# Patient Record
Sex: Male | Born: 1935 | Race: White | Hispanic: No | Smoking: Never smoker
Health system: Southern US, Community
[De-identification: ages and names within clinical notes are randomized; demographics above are authoritative.]

## PROBLEM LIST (undated history)

## (undated) DIAGNOSIS — K219 Gastro-esophageal reflux disease without esophagitis: Secondary | ICD-10-CM

## (undated) DIAGNOSIS — I10 Essential (primary) hypertension: Secondary | ICD-10-CM

## (undated) DIAGNOSIS — I2510 Atherosclerotic heart disease of native coronary artery without angina pectoris: Secondary | ICD-10-CM

## (undated) DIAGNOSIS — I219 Acute myocardial infarction, unspecified: Secondary | ICD-10-CM

## (undated) DIAGNOSIS — E785 Hyperlipidemia, unspecified: Secondary | ICD-10-CM

## (undated) HISTORY — DX: Hyperlipidemia, unspecified: E78.5

## (undated) HISTORY — DX: Essential (primary) hypertension: I10

---

## 1999-03-08 ENCOUNTER — Inpatient Hospital Stay (HOSPITAL_COMMUNITY): Admission: EM | Admit: 1999-03-08 | Discharge: 1999-03-15 | Payer: Self-pay | Admitting: Emergency Medicine

## 1999-03-08 ENCOUNTER — Encounter: Payer: Self-pay | Admitting: Emergency Medicine

## 1999-03-09 ENCOUNTER — Encounter: Payer: Self-pay | Admitting: Family Medicine

## 1999-03-20 ENCOUNTER — Encounter: Admission: RE | Admit: 1999-03-20 | Discharge: 1999-03-20 | Payer: Self-pay | Admitting: Family Medicine

## 1999-04-05 ENCOUNTER — Ambulatory Visit (HOSPITAL_COMMUNITY): Admission: RE | Admit: 1999-04-05 | Discharge: 1999-04-05 | Payer: Self-pay | Admitting: Family Medicine

## 1999-04-09 HISTORY — PX: CORONARY ANGIOPLASTY WITH STENT PLACEMENT: SHX49

## 1999-04-17 ENCOUNTER — Encounter: Admission: RE | Admit: 1999-04-17 | Discharge: 1999-04-17 | Payer: Self-pay | Admitting: Sports Medicine

## 1999-04-20 ENCOUNTER — Encounter: Admission: RE | Admit: 1999-04-20 | Discharge: 1999-04-20 | Payer: Self-pay | Admitting: Family Medicine

## 1999-05-24 ENCOUNTER — Encounter: Admission: RE | Admit: 1999-05-24 | Discharge: 1999-05-24 | Payer: Self-pay | Admitting: Family Medicine

## 1999-06-14 ENCOUNTER — Encounter: Admission: RE | Admit: 1999-06-14 | Discharge: 1999-06-14 | Payer: Self-pay | Admitting: Family Medicine

## 1999-07-30 ENCOUNTER — Encounter: Admission: RE | Admit: 1999-07-30 | Discharge: 1999-07-30 | Payer: Self-pay | Admitting: Family Medicine

## 2000-01-24 ENCOUNTER — Encounter: Admission: RE | Admit: 2000-01-24 | Discharge: 2000-01-24 | Payer: Self-pay | Admitting: Family Medicine

## 2000-04-21 ENCOUNTER — Encounter: Admission: RE | Admit: 2000-04-21 | Discharge: 2000-04-21 | Payer: Self-pay | Admitting: Family Medicine

## 2000-08-05 ENCOUNTER — Encounter: Admission: RE | Admit: 2000-08-05 | Discharge: 2000-08-05 | Payer: Self-pay | Admitting: Sports Medicine

## 2000-10-29 ENCOUNTER — Encounter: Admission: RE | Admit: 2000-10-29 | Discharge: 2000-10-29 | Payer: Self-pay | Admitting: Family Medicine

## 2001-04-27 ENCOUNTER — Encounter: Admission: RE | Admit: 2001-04-27 | Discharge: 2001-04-27 | Payer: Self-pay | Admitting: Sports Medicine

## 2001-07-03 ENCOUNTER — Encounter: Admission: RE | Admit: 2001-07-03 | Discharge: 2001-07-03 | Payer: Self-pay | Admitting: Family Medicine

## 2001-07-06 ENCOUNTER — Encounter: Admission: RE | Admit: 2001-07-06 | Discharge: 2001-07-06 | Payer: Self-pay | Admitting: Family Medicine

## 2001-11-04 ENCOUNTER — Encounter: Admission: RE | Admit: 2001-11-04 | Discharge: 2001-11-04 | Payer: Self-pay | Admitting: Family Medicine

## 2002-02-05 ENCOUNTER — Encounter: Admission: RE | Admit: 2002-02-05 | Discharge: 2002-02-05 | Payer: Self-pay | Admitting: Family Medicine

## 2002-05-05 ENCOUNTER — Encounter: Admission: RE | Admit: 2002-05-05 | Discharge: 2002-05-05 | Payer: Self-pay | Admitting: Family Medicine

## 2002-05-24 ENCOUNTER — Ambulatory Visit (HOSPITAL_COMMUNITY): Admission: RE | Admit: 2002-05-24 | Discharge: 2002-05-24 | Payer: Self-pay | Admitting: Family Medicine

## 2002-05-24 ENCOUNTER — Encounter: Payer: Self-pay | Admitting: Family Medicine

## 2002-05-24 ENCOUNTER — Encounter: Admission: RE | Admit: 2002-05-24 | Discharge: 2002-05-24 | Payer: Self-pay | Admitting: Family Medicine

## 2002-11-04 ENCOUNTER — Encounter: Admission: RE | Admit: 2002-11-04 | Discharge: 2002-11-04 | Payer: Self-pay | Admitting: Family Medicine

## 2003-01-12 ENCOUNTER — Encounter: Admission: RE | Admit: 2003-01-12 | Discharge: 2003-01-12 | Payer: Self-pay | Admitting: Family Medicine

## 2003-04-25 ENCOUNTER — Encounter: Admission: RE | Admit: 2003-04-25 | Discharge: 2003-04-25 | Payer: Self-pay | Admitting: Family Medicine

## 2003-05-03 ENCOUNTER — Encounter: Admission: RE | Admit: 2003-05-03 | Discharge: 2003-05-03 | Payer: Self-pay | Admitting: Sports Medicine

## 2003-07-12 ENCOUNTER — Encounter: Admission: RE | Admit: 2003-07-12 | Discharge: 2003-07-12 | Payer: Self-pay | Admitting: Sports Medicine

## 2003-08-15 ENCOUNTER — Encounter: Admission: RE | Admit: 2003-08-15 | Discharge: 2003-08-15 | Payer: Self-pay | Admitting: Sports Medicine

## 2003-11-09 ENCOUNTER — Encounter: Admission: RE | Admit: 2003-11-09 | Discharge: 2003-11-09 | Payer: Self-pay | Admitting: Family Medicine

## 2003-12-30 ENCOUNTER — Ambulatory Visit: Payer: Self-pay | Admitting: Family Medicine

## 2004-01-06 ENCOUNTER — Ambulatory Visit: Payer: Self-pay | Admitting: Family Medicine

## 2004-03-29 ENCOUNTER — Ambulatory Visit: Payer: Self-pay | Admitting: Family Medicine

## 2004-04-30 ENCOUNTER — Ambulatory Visit: Payer: Self-pay | Admitting: Sports Medicine

## 2004-07-03 ENCOUNTER — Ambulatory Visit: Payer: Self-pay | Admitting: Family Medicine

## 2004-08-30 ENCOUNTER — Ambulatory Visit: Payer: Self-pay | Admitting: Family Medicine

## 2004-11-15 ENCOUNTER — Ambulatory Visit: Payer: Self-pay | Admitting: Family Medicine

## 2004-12-18 ENCOUNTER — Ambulatory Visit: Payer: Self-pay | Admitting: Family Medicine

## 2005-03-04 ENCOUNTER — Ambulatory Visit: Payer: Self-pay | Admitting: Sports Medicine

## 2005-06-05 ENCOUNTER — Ambulatory Visit: Payer: Self-pay | Admitting: Family Medicine

## 2006-06-05 DIAGNOSIS — I252 Old myocardial infarction: Secondary | ICD-10-CM | POA: Insufficient documentation

## 2006-06-05 DIAGNOSIS — I5022 Chronic systolic (congestive) heart failure: Secondary | ICD-10-CM

## 2006-07-23 ENCOUNTER — Ambulatory Visit: Payer: Self-pay | Admitting: Family Medicine

## 2006-07-23 ENCOUNTER — Encounter: Payer: Self-pay | Admitting: Family Medicine

## 2006-07-23 DIAGNOSIS — I10 Essential (primary) hypertension: Secondary | ICD-10-CM | POA: Insufficient documentation

## 2006-07-23 DIAGNOSIS — I2510 Atherosclerotic heart disease of native coronary artery without angina pectoris: Secondary | ICD-10-CM

## 2006-07-23 DIAGNOSIS — E785 Hyperlipidemia, unspecified: Secondary | ICD-10-CM | POA: Insufficient documentation

## 2006-07-23 LAB — CONVERTED CEMR LAB
ALT: 14 units/L (ref 0–53)
AST: 24 units/L (ref 0–37)
Albumin: 4.3 g/dL (ref 3.5–5.2)
Alkaline Phosphatase: 73 units/L (ref 39–117)
BUN: 14 mg/dL (ref 6–23)
CO2: 25 meq/L (ref 19–32)
Calcium: 9.5 mg/dL (ref 8.4–10.5)
Chloride: 103 meq/L (ref 96–112)
Creatinine, Ser: 1.19 mg/dL (ref 0.40–1.50)
Direct LDL: 149 mg/dL — ABNORMAL HIGH
Glucose, Bld: 89 mg/dL (ref 70–99)
PSA: 3.58 ng/mL (ref 0.10–4.00)
Potassium: 4.4 meq/L (ref 3.5–5.3)
Sodium: 139 meq/L (ref 135–145)
Total Bilirubin: 0.9 mg/dL (ref 0.3–1.2)
Total Protein: 7 g/dL (ref 6.0–8.3)

## 2006-07-24 ENCOUNTER — Encounter: Payer: Self-pay | Admitting: Family Medicine

## 2006-10-20 ENCOUNTER — Ambulatory Visit: Payer: Self-pay | Admitting: Family Medicine

## 2006-11-19 ENCOUNTER — Encounter: Payer: Self-pay | Admitting: *Deleted

## 2006-12-22 ENCOUNTER — Ambulatory Visit: Payer: Self-pay | Admitting: Family Medicine

## 2007-03-18 ENCOUNTER — Ambulatory Visit (HOSPITAL_COMMUNITY): Admission: RE | Admit: 2007-03-18 | Discharge: 2007-03-18 | Payer: Self-pay | Admitting: Family Medicine

## 2007-03-18 ENCOUNTER — Encounter: Payer: Self-pay | Admitting: Family Medicine

## 2007-03-18 ENCOUNTER — Ambulatory Visit: Payer: Self-pay | Admitting: Family Medicine

## 2007-03-24 LAB — CONVERTED CEMR LAB
ALT: 11 units/L (ref 0–53)
AST: 19 units/L (ref 0–37)
Albumin: 4.1 g/dL (ref 3.5–5.2)
Alkaline Phosphatase: 71 units/L (ref 39–117)
BUN: 14 mg/dL (ref 6–23)
CO2: 25 meq/L (ref 19–32)
Calcium: 9 mg/dL (ref 8.4–10.5)
Chloride: 106 meq/L (ref 96–112)
Creatinine, Ser: 1.22 mg/dL (ref 0.40–1.50)
Digitoxin Lvl: 0.2 ng/mL — ABNORMAL LOW (ref 0.8–2.0)
Glucose, Bld: 100 mg/dL — ABNORMAL HIGH (ref 70–99)
Potassium: 4.7 meq/L (ref 3.5–5.3)
Sodium: 140 meq/L (ref 135–145)
TSH: 0.837 microintl units/mL (ref 0.350–5.50)
Total Bilirubin: 0.6 mg/dL (ref 0.3–1.2)
Total Protein: 6.9 g/dL (ref 6.0–8.3)

## 2007-03-26 ENCOUNTER — Ambulatory Visit: Payer: Self-pay | Admitting: Family Medicine

## 2007-04-10 ENCOUNTER — Ambulatory Visit: Payer: Self-pay | Admitting: Family Medicine

## 2007-04-15 ENCOUNTER — Encounter: Payer: Self-pay | Admitting: Family Medicine

## 2008-04-30 ENCOUNTER — Encounter: Payer: Self-pay | Admitting: Emergency Medicine

## 2008-04-30 ENCOUNTER — Inpatient Hospital Stay (HOSPITAL_COMMUNITY): Admission: AD | Admit: 2008-04-30 | Discharge: 2008-05-06 | Payer: Self-pay | Admitting: Family Medicine

## 2008-04-30 ENCOUNTER — Encounter: Payer: Self-pay | Admitting: Family Medicine

## 2008-04-30 ENCOUNTER — Ambulatory Visit: Payer: Self-pay | Admitting: Family Medicine

## 2008-05-02 ENCOUNTER — Encounter: Payer: Self-pay | Admitting: Family Medicine

## 2008-05-16 ENCOUNTER — Telehealth: Payer: Self-pay | Admitting: Family Medicine

## 2008-06-01 ENCOUNTER — Telehealth: Payer: Self-pay | Admitting: *Deleted

## 2008-06-06 ENCOUNTER — Telehealth: Payer: Self-pay | Admitting: Family Medicine

## 2008-06-08 ENCOUNTER — Ambulatory Visit: Payer: Self-pay | Admitting: Family Medicine

## 2008-06-09 ENCOUNTER — Telehealth: Payer: Self-pay | Admitting: Family Medicine

## 2008-06-09 ENCOUNTER — Encounter: Payer: Self-pay | Admitting: Family Medicine

## 2008-06-22 ENCOUNTER — Ambulatory Visit: Payer: Self-pay | Admitting: Family Medicine

## 2008-06-27 ENCOUNTER — Ambulatory Visit: Payer: Self-pay | Admitting: Family Medicine

## 2008-06-27 ENCOUNTER — Encounter: Payer: Self-pay | Admitting: Family Medicine

## 2008-06-27 LAB — CONVERTED CEMR LAB
ALT: <8 units/L (ref 0–53)
AST: 12 units/L (ref 0–37)
Albumin: 4 g/dL (ref 3.5–5.2)
Alkaline Phosphatase: 72 units/L (ref 39–117)
BUN: 12 mg/dL (ref 6–23)
CO2: 25 meq/L (ref 19–32)
Calcium: 8.4 mg/dL (ref 8.4–10.5)
Chloride: 109 meq/L (ref 96–112)
Cholesterol: 92 mg/dL (ref 0–200)
Creatinine, Ser: 0.98 mg/dL (ref 0.40–1.50)
Glucose, Bld: 104 mg/dL — ABNORMAL HIGH (ref 70–99)
HDL: 31 mg/dL — ABNORMAL LOW (ref >39)
LDL Cholesterol: 44 mg/dL (ref 0–99)
PSA: 3.33 ng/mL (ref 0.10–4.00)
Potassium: 4.3 meq/L (ref 3.5–5.3)
Sodium: 142 meq/L (ref 135–145)
Total Bilirubin: 0.4 mg/dL (ref 0.3–1.2)
Total CHOL/HDL Ratio: 3
Total Protein: 6.4 g/dL (ref 6.0–8.3)
Triglycerides: 83 mg/dL (ref <150)
VLDL: 17 mg/dL (ref 0–40)

## 2008-06-28 ENCOUNTER — Encounter: Payer: Self-pay | Admitting: Family Medicine

## 2008-07-13 ENCOUNTER — Ambulatory Visit: Payer: Self-pay | Admitting: Family Medicine

## 2008-08-03 ENCOUNTER — Ambulatory Visit: Payer: Self-pay | Admitting: Family Medicine

## 2008-08-31 ENCOUNTER — Ambulatory Visit: Payer: Self-pay | Admitting: Family Medicine

## 2008-09-26 ENCOUNTER — Ambulatory Visit: Payer: Self-pay | Admitting: Family Medicine

## 2008-10-12 ENCOUNTER — Encounter: Payer: Self-pay | Admitting: Family Medicine

## 2008-10-26 ENCOUNTER — Encounter: Payer: Self-pay | Admitting: Family Medicine

## 2009-04-13 ENCOUNTER — Encounter: Payer: Self-pay | Admitting: Family Medicine

## 2009-04-17 ENCOUNTER — Ambulatory Visit: Payer: Self-pay | Admitting: Family Medicine

## 2009-04-17 DIAGNOSIS — K5900 Constipation, unspecified: Secondary | ICD-10-CM | POA: Insufficient documentation

## 2009-04-26 ENCOUNTER — Ambulatory Visit: Payer: Self-pay | Admitting: Family Medicine

## 2009-06-27 ENCOUNTER — Encounter: Payer: Self-pay | Admitting: Family Medicine

## 2009-06-28 ENCOUNTER — Encounter: Payer: Self-pay | Admitting: Family Medicine

## 2009-10-31 ENCOUNTER — Encounter: Payer: Self-pay | Admitting: Family Medicine

## 2009-10-31 ENCOUNTER — Ambulatory Visit: Payer: Self-pay | Admitting: Family Medicine

## 2009-12-13 LAB — CONVERTED CEMR LAB
ALT: <8 units/L (ref 0–53)
AST: 15 units/L (ref 0–37)
Albumin: 4.2 g/dL (ref 3.5–5.2)
Alkaline Phosphatase: 63 units/L (ref 39–117)
BUN: 12 mg/dL (ref 6–23)
CO2: 23 meq/L (ref 19–32)
Calcium: 8.6 mg/dL (ref 8.4–10.5)
Chloride: 104 meq/L (ref 96–112)
Cholesterol: 101 mg/dL (ref 0–200)
Creatinine, Ser: 0.95 mg/dL (ref 0.40–1.50)
Glucose, Bld: 91 mg/dL (ref 70–99)
HDL: 30 mg/dL — ABNORMAL LOW (ref >39)
LDL Cholesterol: 57 mg/dL (ref 0–99)
Potassium: 3.6 meq/L (ref 3.5–5.3)
Sodium: 138 meq/L (ref 135–145)
Total Bilirubin: 0.8 mg/dL (ref 0.3–1.2)
Total CHOL/HDL Ratio: 3.4
Total Protein: 6.5 g/dL (ref 6.0–8.3)
Triglycerides: 71 mg/dL (ref <150)
VLDL: 14 mg/dL (ref 0–40)

## 2010-01-12 ENCOUNTER — Encounter: Payer: Self-pay | Admitting: Family Medicine

## 2010-05-02 ENCOUNTER — Encounter (INDEPENDENT_AMBULATORY_CARE_PROVIDER_SITE_OTHER): Payer: Self-pay | Admitting: *Deleted

## 2010-05-08 NOTE — Assessment & Plan Note (Signed)
Summary: review meds/eo  Nurse Visit patient walks in stating he has concern that he may not be taking medciations correctly.  The concern is about Loperamide 2 mg that he was instructed to get after coming to office on 04/13/2009. States he has been taking 3 tablets each night since 04/13/2009. Upon further  questioning patient states he never had diarrhea. His problem  on the night before coming  to the office on 04/13/2009 was a feeling of constipation. States on 04/12/2009 he felt much pressure and discomfort in rectal area like he needed to have a BM but couldn't. Last good BM was 2-3 days prior to that. On 04/13/2009 he did pass 3 golf ball size stools after lubicating rectal area with vaseline. He felt much better .  He has had no BM since.Marland KitchenConsulted with Dr. Fredric Mare and he recommends patient get 2 Fleets enemas. Use one today and if  poor results repeat tomorrow. If still no BM by the morning of 04/19/2009 will need to be seen. Instructions written down for patient. ALL meds reviewed and he does have correct meds on hand and is taking correctly. Theresia Lo RN  April 17, 2009 12:23 PM   Allergies: No Known Drug Allergies  Orders Added: 1)  Est Level 1- [facility] [[address]347]

## 2010-05-08 NOTE — Assessment & Plan Note (Signed)
Summary: htn check/[place]   Vital Signs:  Patient profile:   77 year old male Height:      71.75 inches Weight:      215 pounds BMI:     29.47 BSA:     2.19 Temp:     97.5 degrees F Pulse rate:   109 / minute BP sitting:   119 / 62  Vitals Entered By: Jone Baseman CMA (April 26, 2009 2:50 PM) CC: htn, constipation Is Patient Diabetic? No Pain Assessment Patient in pain? no        Primary Care Provider:  Romero Belling MD  CC:  htn and constipation.  History of Present Illness: HTN.  No chest pain, dyspnea, LE edema.  Taking pills as prescribed.  Demonstrates pill boxes today.  CONSTIPATION.  Improved, now stooling at least twice weekly.  Tried fleets enema without relief, but better after dulcolax.  Uses dulcolax as needed, but no more than twice weekly.  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  Single:  widowed 2008, lives alone.  No natural children, one step-daughter.  Smoked only 4 days of his life.  No EtOH/illicit drug use.  As of 04/26/2009:  independent of ADLs/IADLs.  MMSE 24/30.  Physical Exam  Additional Exam:  VITALS:  Reviewed, normal GEN: Alert & oriented, no acute distress CARDIO: Regular rate and rhythm, no murmurs/rubs/gallops, 2+ bilateral radial pulses RESP: Clear to auscultation, normal work of breathing, no retractions/accessory muscle use EXT: Nontender, no edema    Impression & Recommendations:  Problem # 1:  HYPERTENSION (ICD-401.9) Assessment Improved  His updated medication list for this problem includes:    Lisinopril 40 Mg Tabs (Lisinopril) .Marland Kitchen... 1 tab by mouth daily    Carvedilol 6.25 Mg Tabs (Carvedilol) .Marland Kitchen... 1 tab by mouth two times a day    Amlodipine Besylate 10 Mg Tabs (Amlodipine besylate) .Marland Kitchen... 1 tab by mouth daily for high blood pressure  Orders: [facility]- Est  Level 4  (99214)  BP today: 119/62 Prior BP: 120/62 (09/26/2008)  Labs Reviewed: K+: 4.3 (06/27/2008) Creat: : 0.98 (06/27/2008)   Chol: 92 (06/27/2008)   HDL: 31 (06/27/2008)   LDL: 44 (06/27/2008)   TG: 83 (06/27/2008)  Problem # 2:  CONSTIPATION (ICD-564.00) Assessment: Improved  Dulcolax as needed, advised no more than twice weekly. Orders: [facility]- Est  Level 4 ([address])  Problem # 3:  Preventive Health Care (ICD-V70.0) Assessment: Comment Only Declines flu and Tdap vaccines.  Reminded of colonoscopy appointment.  Complete Medication List: 1)  Simvastatin 80 Mg Tabs (Simvastatin) .Marland Kitchen.. 1 tab by mouth daily 2)  Aspir-low 81 Mg Tbec (Aspirin) .Marland Kitchen.. 1 tab by mouth once daily 3)  Lisinopril 40 Mg Tabs (Lisinopril) .Marland Kitchen.. 1 tab by mouth daily 4)  Isosorbide Mononitrate Cr 60 Mg Xr24h-tab (Isosorbide mononitrate) .Marland Kitchen.. 1 tab by mouth daily 5)  Plavix 75 Mg Tabs (Clopidogrel bisulfate) .Marland Kitchen.. 1 tab by mouth daily 6)  Carvedilol 6.25 Mg Tabs (Carvedilol) .Marland Kitchen.. 1 tab by mouth two times a day 7)  Amlodipine Besylate 10 Mg Tabs (Amlodipine besylate) .Marland Kitchen.. 1 tab by mouth daily for high blood pressure  Patient Instructions: 1)  Please schedule a follow-up appointment in 4 months. 2)  Please call [facility] to re-schedule your colonoscopy:  [phone]   Geriatric Assessment:  Activities of Daily Living:    Bathing-independent  Dressing-independent    Eating-independent    Toileting-independent    Transferring-independent    Continence-independent  Instrumental Activities of Daily Living:    Transportation-independent    Meal/Food Preparation-independent    Shopping Errands-independent    Housekeeping/Chores-independent    Money Management/Finances-independent    Medication Management-independent    Ability to Use Telephone-independent    Laundry-independent  Mental Status Exam: (value/max value)    Orientation to Time: 5/5    Orientation to Place: 5/5    Registration: 3/3     Attention/Calculation: 2/5    Recall: 0/3    Language-name 2 objects: 2/2    Language-repeat: 1/1    Language-follow 3-step command: 3/3    Language-read and follow direction: 1/1    Write a sentence: 1/1    Copy design: 1/1 MSE Total score: 24/30     Prevention & Chronic Care Immunizations   Influenza vaccine: Not documented   Influenza vaccine deferral: Refused  (04/26/2009)    Tetanus booster: 01/07/1996: Done.   Td booster deferral: Refused  (04/26/2009)   Tetanus booster due: 01/06/2006    Pneumococcal vaccine: Done.  (03/09/1999)   Pneumococcal vaccine deferral: Not indicated  (04/26/2009)   Pneumococcal vaccine due: None    H. zoster vaccine: Not documented  Colorectal Screening   Hemoccult: Done.  (08/06/2004)   Hemoccult due: 08/06/2005    Colonoscopy: Not documented  Other Screening   PSA: 3.33  (06/27/2008)   PSA due due: 07/23/2007   Smoking status: never  (04/26/2009)  Lipids   Total Cholesterol: 92  (06/27/2008)   LDL: 44  (06/27/2008)   LDL Direct: 149  (07/23/2006)   HDL: 31  (06/27/2008)   Triglycerides: 83  (06/27/2008)    SGOT (AST): 12  (06/27/2008)   SGPT (ALT): <8 U/L  (06/27/2008)   Alkaline phosphatase: 72  (06/27/2008)   Total bilirubin: 0.4  (06/27/2008)    Lipid flowsheet reviewed?: Yes   Progress toward LDL goal: At goal  Hypertension   Last Blood Pressure: 119 / 62  (04/26/2009)   Serum creatinine: 0.98  (06/27/2008)   Serum potassium 4.3  (06/27/2008)    Hypertension flowsheet reviewed?: Yes   Progress toward BP goal: At goal  Self-Management Support :   Personal Goals (by the next clinic visit) :      Personal blood pressure goal: 140/90  (04/26/2009)     Personal LDL goal: 70  (04/26/2009)    Hypertension self-management support: Not documented    Lipid self-management support: Not documented

## 2010-05-08 NOTE — Assessment & Plan Note (Signed)
Summary: meds refills/[place]/spiegel   Vital Signs:  Patient profile:   77 year old male Height:      71.75 inches Weight:      212 pounds Temp:     97.9 degrees F oral Pulse rate:   60 / minute BP sitting:   121 / 50  Vitals Entered By: Tessie Fass CMA (October 31, 2009 1:50 PM) CC: med refill   Primary Provider:  Romero Belling MD  CC:  med refill.  History of Present Illness: Patient is a 77 year old male who presents today requesting medication refills.  He was last seen in January of this year.  Since that time he reports resolution of his constipation and no new problems.  Specifically he denies chest pain, dyspnea, visual changes, headaches, leg swelling, or any activity limitations.  He also denies tobacco or alcohol use.  He remains active and is generally happy with life.  Allergies: No Known Drug Allergies  Past History:  Past Medical History: Last updated: 06/08/2008 mmse 26/30 (8/06) Congestive heart failure Coronary artery disease Hyperlipidemia Hypertension MI, 02-Sep-1999, stents placed Stenting of LAD and LCA 09/01/2008  Past Surgical History: Last updated: 2008-05-15 Cardiac Cath - total occlusion LAD s/p ptca,nl l ima - 03/09/1999,  Echo- 40% ef - 03/09/1999,  exercise stress test - 4 weeks post-mi, tolerated well, no ischemic changes, good prognosis by test - 03/09/1999,  Persantine Cardiolite 2/05 [facility]--low risk - 05/18/2003  Family History: Last updated: 05-15-08 Dad - Died of MI age 34 yo Mom - Died 02-Sep-1994 from unknown cause Siblings - Brother - HLD, DM  Social History: Last updated: 04/26/2009 Occupation:  Retired, 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  Single:  widowed 09/02/2006, lives alone.  No natural children, one step-daughter.  Smoked only 4 days of his life.  No EtOH/illicit drug use.  As of 04/26/2009:  independent of ADLs/IADLs.  MMSE 24/30.  Risk Factors: Alcohol Use: 0 (06/27/2008)  Review of Systems  The patient denies anorexia, fever,  weight loss, weight gain, vision loss, chest pain, syncope, dyspnea on exertion, peripheral edema, prolonged cough, headaches, abdominal pain, melena, hematochezia, hematuria, incontinence, muscle weakness, and unusual weight change.    Physical Exam  General:  Alert and oriented, no acute distress. Head:  Normocephalic and atraumatic without obvious abnormalities. Eyes:  vision grossly intact, pupils equal, pupils round, and pupils reactive to light.   Mouth:  pharynx pink and moist, no erythema, no exudates, and teeth missing.   Neck:  supple, full ROM, and no masses.   Lungs:  Clear to auscultation bilateral with normal work of breathing. Heart:  Regular rate and rhythm without murmurs, rubs, or gallops.  Normal precordium. Abdomen:  soft, non-tender, normal bowel sounds, no distention, no masses, no guarding, and no rigidity.  Midline abdominal hernia that reduces easily Msk:  normal ROM, no joint tenderness, no joint swelling, and no muscle atrophy.   Pulses:  R posterior tibial normal and L posterior tibial normal.   Extremities:  trace left pedal edema and trace right pedal edema.   Neurologic:  alert & oriented X3, cranial nerves II-XII intact, strength normal in all extremities, and sensation intact to light touch.      Impression & Recommendations:  Problem # 1:  HYPERTENSION (ICD-401.9) Blood pressure 121/50 today.  This appears well controlled.  Will continue current management.  His updated medication list for this problem includes:    Lisinopril 40 Mg Tabs (Lisinopril) .Marland Kitchen... 1 tab by  mouth daily    Carvedilol 6.25 Mg Tabs (Carvedilol) .Marland Kitchen... 1 tab by mouth two times a day    Amlodipine Besylate 10 Mg Tabs (Amlodipine besylate) .Marland Kitchen... 1 tab by mouth daily for high blood pressure  Orders: Comp Met-[facility] ([address]) Lipid-[facility] ([address]11-91478) [facility]- Est Level  3 (29562)  Problem # 2:  HYPERLIPIDEMIA (ICD-272.4) Patient has not had a FLP in over a year.  Will obtain a FLP and  CMP today.  His updated medication list for this problem includes:    Simvastatin 80 Mg Tabs (Simvastatin) .Marland Kitchen... 1 tab by mouth daily  Orders: Comp Met-[facility] (13086-57846) Lipid-[facility] (96295-28413) [facility]- Est Level  3 (24401)  Problem # 3:  CHF - EJECTION FRACTION < 50% (ICD-428.22) Patient reports no chest pain and has only trace edema on exam.  Will continue current management.  His updated medication list for this problem includes:    Aspir-low 81 Mg Tbec (Aspirin) .Marland Kitchen... 1 tab by mouth once daily    Lisinopril 40 Mg Tabs (Lisinopril) .Marland Kitchen... 1 tab by mouth daily    Plavix 75 Mg Tabs (Clopidogrel bisulfate) .Marland Kitchen... 1 tab by mouth daily    Carvedilol 6.25 Mg Tabs (Carvedilol) .Marland Kitchen... 1 tab by mouth two times a day  Orders: [facility]- Est Level  3 (02725)  Complete Medication List: 1)  Simvastatin 80 Mg Tabs (Simvastatin) .Marland Kitchen.. 1 tab by mouth daily 2)  Aspir-low 81 Mg Tbec (Aspirin) .Marland Kitchen.. 1 tab by mouth once daily 3)  Lisinopril 40 Mg Tabs (Lisinopril) .Marland Kitchen.. 1 tab by mouth daily 4)  Isosorbide Mononitrate Cr 60 Mg Xr24h-tab (Isosorbide mononitrate) .Marland Kitchen.. 1 tab by mouth daily 5)  Plavix 75 Mg Tabs (Clopidogrel bisulfate) .Marland Kitchen.. 1 tab by mouth daily 6)  Carvedilol 6.25 Mg Tabs (Carvedilol) .Marland Kitchen.. 1 tab by mouth two times a day 7)  Amlodipine Besylate 10 Mg Tabs (Amlodipine besylate) .Marland Kitchen.. 1 tab by mouth daily for high blood pressure  Patient Instructions: 1)  It was great to see you today! 2)  I will go ahead and send in authorization to refil your prescriptions. 3)  We will draw some blood for tests today.  If you don't hear anything from [place], you can assume that they are normal. 4)  Please schedule an appointment with your doctor, Dr. Hulen Luster, in 6 months so you can continue to have your prescriptions filled. Prescriptions: CARVEDILOL 6.25 MG TABS (CARVEDILOL) 1 tab by mouth two times a day  #60 Tablet x 5   Entered and Authorized by:   Majel Homer MD   Signed by:   Majel Homer MD on 10/31/2009   Method  used:   Electronically to        [facility] [phone]* (retail)       [address]       [place], [place]  [address]       Ph: [phone] or [phone]       Fax: [phone]   RxID:   [id number] ISOSORBIDE MONONITRATE CR 60 MG XR24H-TAB (ISOSORBIDE MONONITRATE) 1 tab by mouth daily  #30 Tablet x 5   Entered and Authorized by:   Majel Homer MD   Signed by:   Majel Homer MD on 10/31/2009   Method used:   Electronically to        [facility] [phone]* (retail)       [address]  [place], [place]  [address]       Ph: [phone] or [phone]       Fax: [phone]   RxID:   [id number] LISINOPRIL 40 MG TABS (LISINOPRIL) 1 tab by mouth daily  #30 Tablet x 5   Entered and Authorized by:   Majel Homer MD   Signed by:   Majel Homer MD on 10/31/2009   Method used:   Electronically to        [facility] [phone]* (retail)       [address]       [place], [place]  [address]       Ph: [phone] or [phone]       Fax: [phone]   RxID:   [id number]   Appended Document: Orders Update    Clinical Lists Changes  Orders: Added new Test order of [facility]- Est  Level 4 ([address]42) - Signed

## 2010-05-08 NOTE — Procedures (Signed)
Summary: [facility]  [facility]   Imported By: Knox Royalty 11/10/2009 10:06:39  _____________________________________________________________________  External Attachment:    Type:   Image     Comment:   External Document  Appended Document: [facility] tubular adenoma, repeat colonoscopy 2016

## 2010-05-08 NOTE — Consult Note (Signed)
Summary: [facility]  [facility]   Imported By: Clydell Hakim 07/12/2009 11:28:25  _____________________________________________________________________  External Attachment:    Type:   Image     Comment:   External Document

## 2010-05-08 NOTE — Miscellaneous (Signed)
Summary: colonoscopy results  Clinical Lists Changes  Observations: Added new observation of FLEXSIGDUE: Not Indicated (06/28/2009 15:11) Added new observation of COLONNXTDUE: 06/28/2014 (06/28/2009 15:11) Added new observation of LST COLON DT: 06/27/2009 (06/27/2009 15:12) Added new observation of COLONOSCOPY: normal (06/27/2009 15:12)     Last Flex Sig:  Done. (05/09/2002 12:00:00 AM) Flex Sig Result Date:  06/27/2009 Flex Sig Next Due:  Not Indicated Colonoscopy Result Date:  06/27/2009 Colonoscopy Result:  normal Colonoscopy Next Due:  5 yr Last Hemoccult Result: Done. (08/06/2004 12:00:00 AM) Hemoccult Result Date:  06/27/2009 Hemoccult Next Due:  Not Indicated

## 2010-05-08 NOTE — Miscellaneous (Signed)
   Clinical Lists Changes  Problems: Changed problem from CHF - EJECTION FRACTION < 50% (ICD-428.22) to CHRONIC SYSTOLIC HEART FAILURE (ICD-428.22) 

## 2010-05-08 NOTE — Miscellaneous (Signed)
Summary: Walk in  Clinical Lists Changes walked in wanting to know what otc he can take when he gets diarrhea. states he had it last night but none since. appt made to see pcp on 1/19 for htn & med check.[place]en Circle RN  April 13, 2009 10:54 AM  Immodium would be fine.  Romero Belling MD  April 13, 2009 12:19 PM    Appended Document: Walk in I had advised immodium to pt. wrote the name of it down & reviewed how to take. neglected to document in my note

## 2010-05-10 NOTE — Letter (Signed)
Summary: Generic Letter  [facility]  [address]   [place], [place] [address]   Phone: [phone]  Fax: [phone]    05/02/2010  [address] [place], [place]  [address]  Dear Michael Morales,  We are happy to let you know that since you are covered under Medicare you are able to have a FREE visit at the [facility] to discuss your HEALTH. This is a new benefit for Medicare.  There will be no co-payment.  At this visit you will meet with Arlys John an expert in wellness and the health coach at our clinic.  At this visit we will discuss ways to keep you healthy and feeling well.  This visit will not replace your regular doctor visit and we cannot refill medications.     You will need to plan to be here at least one hour to talk about your medical history, your current status, review all of your medications, and discuss your future plans for your health.  This information will be entered into your record for your doctor to have and review.  If you are interested in staying healthy, this type of visit can help.  Please call the office at: [phone], to schedule a "Medicare Wellness Visit".  The day of the visit you should bring in all of your medications, including any vitamins, herbs, over the counter products you take.  Make a list of all the other doctors that you see, so we know who they are. If you have any other health documents please bring them.  We look forward to helping you stay healthy.  Sincerely,   [facility]  i[facility]

## 2010-05-17 ENCOUNTER — Ambulatory Visit: Payer: Self-pay | Admitting: Home Health Services

## 2010-05-23 ENCOUNTER — Encounter: Payer: Self-pay | Admitting: Family Medicine

## 2010-05-23 ENCOUNTER — Ambulatory Visit (INDEPENDENT_AMBULATORY_CARE_PROVIDER_SITE_OTHER): Payer: Medicare Other | Admitting: Family Medicine

## 2010-05-23 DIAGNOSIS — I2510 Atherosclerotic heart disease of native coronary artery without angina pectoris: Secondary | ICD-10-CM

## 2010-05-23 DIAGNOSIS — E785 Hyperlipidemia, unspecified: Secondary | ICD-10-CM

## 2010-05-23 DIAGNOSIS — I10 Essential (primary) hypertension: Secondary | ICD-10-CM

## 2010-05-23 NOTE — Assessment & Plan Note (Addendum)
Well controlled.   Tolerating simvastatin at 80 mg well.  Consider changing to lipitor once prices are lower since is on amlodipine also

## 2010-05-23 NOTE — Assessment & Plan Note (Signed)
On antiplatelets and statins.  Walking without problems.

## 2010-05-23 NOTE — Patient Instructions (Signed)
Keep taking exactly what you are taking Call us if you have any problems

## 2010-05-23 NOTE — Assessment & Plan Note (Signed)
Well contolled Continue current meds

## 2010-05-23 NOTE — Progress Notes (Signed)
  Subjective:    Patient ID: Michael [facility], male    DOB: 02-11-1928, 77 y.o.   MRN: [id number]  HPI   HYPERTENSION Disease Monitoring Blood pressure range-not checking Chest pain- No      Dyspnea- No Medications Compliance- has meds and name card Lightheadedness- No   Edema- No    HYPERLIPIDEMIA Disease Monitoring See symptoms for Hypertension Medications Compliance- daily simvastatin  RUQ pain- No  Muscle aches- No    CAD Taking asa and plavix.  No chest pain or shortness of breath with exertion.  ROS See HPI above   PMH Smoking Status noted     Review of Systems     Objective:   Physical Exam  Constitutional: He appears well-developed and well-nourished.  Cardiovascular: Normal rate, regular rhythm and normal heart sounds.  Exam reveals no gallop and no friction rub.   No murmur heard. Pulmonary/Chest: No respiratory distress. He has no rales.  Musculoskeletal: He exhibits no edema.           Assessment & Plan:

## 2010-06-13 ENCOUNTER — Other Ambulatory Visit: Payer: Self-pay | Admitting: Family Medicine

## 2010-06-13 NOTE — Telephone Encounter (Signed)
Refill request

## 2010-07-09 ENCOUNTER — Other Ambulatory Visit: Payer: Self-pay | Admitting: Family Medicine

## 2010-07-09 NOTE — Telephone Encounter (Signed)
Refill request

## 2010-07-10 ENCOUNTER — Other Ambulatory Visit: Payer: Self-pay | Admitting: Family Medicine

## 2010-07-10 NOTE — Telephone Encounter (Signed)
Refill request

## 2010-07-23 LAB — DIFFERENTIAL
Basophils Absolute: 0 10*3/uL (ref 0.0–0.1)
Lymphocytes Relative: 23 % (ref 12–46)
Neutro Abs: 6.2 10*3/uL (ref 1.7–7.7)

## 2010-07-23 LAB — CBC
HCT: 38.3 % — ABNORMAL LOW (ref 39.0–52.0)
Hemoglobin: 11.9 g/dL — ABNORMAL LOW (ref 13.0–17.0)
Hemoglobin: 12.1 g/dL — ABNORMAL LOW (ref 13.0–17.0)
Hemoglobin: 12.6 g/dL — ABNORMAL LOW (ref 13.0–17.0)
MCHC: 33.5 g/dL (ref 30.0–36.0)
Platelets: 195 10*3/uL (ref 150–400)
Platelets: 204 10*3/uL (ref 150–400)
Platelets: 212 10*3/uL (ref 150–400)
RBC: 3.56 MIL/uL — ABNORMAL LOW (ref 4.22–5.81)
RBC: 3.67 MIL/uL — ABNORMAL LOW (ref 4.22–5.81)
RBC: 3.86 MIL/uL — ABNORMAL LOW (ref 4.22–5.81)
RDW: 12.6 % (ref 11.5–15.5)
RDW: 12.8 % (ref 11.5–15.5)
RDW: 13.3 % (ref 11.5–15.5)
WBC: 6.3 10*3/uL (ref 4.0–10.5)
WBC: 9.3 10*3/uL (ref 4.0–10.5)

## 2010-07-23 LAB — BASIC METABOLIC PANEL
BUN: 12 mg/dL (ref 6–23)
BUN: 21 mg/dL (ref 6–23)
BUN: 22 mg/dL (ref 6–23)
CO2: 26 mEq/L (ref 19–32)
CO2: 26 mEq/L (ref 19–32)
CO2: 27 mEq/L (ref 19–32)
Calcium: 8.4 mg/dL (ref 8.4–10.5)
Calcium: 8.4 mg/dL (ref 8.4–10.5)
Calcium: 8.7 mg/dL (ref 8.4–10.5)
Calcium: 8.8 mg/dL (ref 8.4–10.5)
Calcium: 8.9 mg/dL (ref 8.4–10.5)
Chloride: 103 mEq/L (ref 96–112)
Chloride: 105 mEq/L (ref 96–112)
Creatinine, Ser: 0.98 mg/dL (ref 0.4–1.5)
Creatinine, Ser: 1.19 mg/dL (ref 0.4–1.5)
GFR calc Af Amer: >60 mL/min (ref >60)
GFR calc Af Amer: >60 mL/min (ref >60)
GFR calc Af Amer: >60 mL/min (ref >60)
GFR calc non Af Amer: 38 mL/min — ABNORMAL LOW (ref >60)
GFR calc non Af Amer: 59 mL/min — ABNORMAL LOW (ref >60)
GFR calc non Af Amer: >60 mL/min (ref >60)
GFR calc non Af Amer: >60 mL/min (ref >60)
GFR calc non Af Amer: >60 mL/min (ref >60)
Glucose, Bld: 104 mg/dL — ABNORMAL HIGH (ref 70–99)
Glucose, Bld: 117 mg/dL — ABNORMAL HIGH (ref 70–99)
Glucose, Bld: 148 mg/dL — ABNORMAL HIGH (ref 70–99)
Glucose, Bld: 92 mg/dL (ref 70–99)
Potassium: 3.7 mEq/L (ref 3.5–5.1)
Potassium: 3.7 mEq/L (ref 3.5–5.1)
Potassium: 3.8 mEq/L (ref 3.5–5.1)
Potassium: 3.9 mEq/L (ref 3.5–5.1)
Sodium: 132 mEq/L — ABNORMAL LOW (ref 135–145)
Sodium: 133 mEq/L — ABNORMAL LOW (ref 135–145)
Sodium: 138 mEq/L (ref 135–145)
Sodium: 138 mEq/L (ref 135–145)

## 2010-07-23 LAB — CARDIAC PANEL(CRET KIN+CKTOT+MB+TROPI)
CK, MB: 1.5 ng/mL (ref 0.3–4.0)
Relative Index: INVALID (ref 0.0–2.5)
Relative Index: INVALID (ref 0.0–2.5)
Total CK: 36 U/L (ref 7–232)
Total CK: 37 U/L (ref 7–232)
Total CK: 74 U/L (ref 7–232)
Total CK: 77 U/L (ref 7–232)
Troponin I: 0.08 ng/mL — ABNORMAL HIGH (ref 0.00–0.06)

## 2010-07-23 LAB — LIPID PANEL
Cholesterol: 135 mg/dL (ref 0–200)
LDL Cholesterol: 91 mg/dL (ref 0–99)
Total CHOL/HDL Ratio: 5.2 RATIO
Triglycerides: 88 mg/dL (ref <150)

## 2010-07-23 LAB — POCT CARDIAC MARKERS
CKMB, poc: <1 ng/mL — ABNORMAL LOW (ref 1.0–8.0)
Myoglobin, poc: 90.1 ng/mL (ref 12–200)
Troponin i, poc: <0.05 ng/mL (ref 0.00–0.09)

## 2010-07-23 LAB — PROTIME-INR
INR: 1.1 (ref 0.00–1.49)
INR: 1.2 (ref 0.00–1.49)
Prothrombin Time: 14.8 seconds (ref 11.6–15.2)

## 2010-07-23 LAB — BRAIN NATRIURETIC PEPTIDE
Pro B Natriuretic peptide (BNP): 113 pg/mL — ABNORMAL HIGH (ref 0.0–100.0)
Pro B Natriuretic peptide (BNP): 79.6 pg/mL (ref 0.0–100.0)

## 2010-07-31 ENCOUNTER — Other Ambulatory Visit: Payer: Self-pay | Admitting: Family Medicine

## 2010-07-31 NOTE — Telephone Encounter (Signed)
Refill request

## 2010-08-20 ENCOUNTER — Other Ambulatory Visit: Payer: Self-pay | Admitting: Family Medicine

## 2010-08-20 NOTE — Telephone Encounter (Signed)
Refill request

## 2010-08-21 NOTE — Cardiovascular Report (Signed)
NAME:  Michael Morales, Michael Morales NO.:  [id number]   MEDICAL RECORD NO.:  [id number]          PATIENT TYPE:  INP   LOCATION:  2502                         FACILITY:  [facility]   PHYSICIAN:  Nicki Guadalajara, M.D.     DATE OF BIRTH:  1927/04/27   DATE OF PROCEDURE:  DATE OF DISCHARGE:                            CARDIAC CATHETERIZATION   INDICATIONS:  [name] is a 77 year old gentleman who has  known coronary artery disease status post remote anterior MI in 2000,  treated with PTCA and stenting of his proximal LAD.  He has reduced LV  function with an ejection fraction of approximately 35%.  He was  admitted following a syncopal spell in April 30, 2008, and also had  intermittent second-degree AV block, which his beta-blocker was held.  The cardiac catheterization was performed on May 02, 2008, which  showed an ejection fraction of 35-40%.  The previously placed near stent  was patent but he had tandem multiple lesions in his proximal LAD beyond  the stented segment as well as several additional lesions high-grade in  his mid LAD.  He also had approximately 60-70% circumflex stenosis in  addition to OM-1 and OM-2 narrowings of 50% and 50-60% proximal RCA.  Cineangiograms were reviewed with colleagues.  He was seen yesterday by  Dr. Allyson Sabal.  After discussion with him concerning options, the patient is  now referred for percutaneous coronary intervention.   PROCEDURE:  After premedication with Valium 5 mg intravenously, the  patient prepped and draped in usual fashion.  His right femoral artery  was punctured anteriorly and a 6-French sheath was inserted without  difficulty.  A bivalve routing was administered in bolus and infusion  and ACT was documented to be therapeutic.  A 6-French Left 4  guide was  used for the interventional procedure.  The patient received 600 mg of  oral Plavix during the procedure.  An ATW wire was advanced down the  LAD, and this was used to  help assess need for stent site due to the  multiple tandem lesions.  Scout angiography confirmed 90% narrowing  beyond the proximal stented segment after the first diagonal septal  perforating artery.  In the mid segment, there was then 95% stenosis,  and another diagonal vessel 90% stenosis after a third diagonal vessel  and another 70% stenosis with some slight vessel taper.  Consequently,  initially a 3.5 x 15 mm XIENCE V stent was inserted and now placed in  tandem with overlapping struts just distal to the remotely placed near  stent in the proximal LAD.  This was dilated to 14 atmospheres  equivalent to approximately 3.8 mm.  A 3.0 x 28 mm XIENCE stent was then  inserted and advanced to the mid-LAD to cover 2 additional 95 and 90%  lesions.  This was dilated up to 14 atmospheres x2.  A third stent 2.75  x 15 mm XIENCE stent was then inserted in tandem just distal to the 3-0  x 28 mm stent to cover the 70% distal lesion.  This was dilated up to 14  atmospheres equivalent  to approximately 3.0.  Post stent dilatation of  all 3 stents was then done and initially a 3.75 x 12-mm noncompliant  Voyager was used to postdilate the proximal 3.5 x 15 mm XIENCE stent to  3.8 mm.  The same balloon was then used to dilate the proximal segment  of the mid stent at lower atmosphere such that to dilate this to  approximately 3.3 mm.  A 3.0 x 20 mm Voyager balloon was then used to  dilate from the distal aspect of the most distal stent placed to the mid  segment.  Scout angiography confirmed an excellent angiographic result  with very smooth vessel taper from 3.8 mm proximally to 3 mm at the most  distal aspect of the last placed stent.  All high-grade stenoses were  reduced to 0%.  There was brisk TIMI III flow.  There was no evidence  for dissection.  Because views today suggested that the proximal  circumflex was 70-80% between the OM-1 and OM-2 vessel, the decision was  made while the guiding  catheter was in place to place a small stent to  cover this very proximal large circumflex system.  The same wire was  advanced down the circumflex.  A 3.0 x 12 mm XIENCE V stent was then  inserted with careful positioning so as not to cover the takeoff of the  OM-1 or OM-2 vessel.  This was dilated to 3.25 mm and poststent  dilatation was done utilizing a 3.25 x 9 mm noncompliance Sprinter  balloon.  Scout angiography confirmed an excellent angiographic result.  There was no evidence for dissection.  There was brisk TIMI III flow.  The patient tolerated procedure well.   IMPRESSION:  Successful complex percutaneous coronary intervention  involving 4 lesions in the left anterior descending artery with  placement of 3 stents in the left anterior descending with a 90%  stenosis being reduced to 0% proximally just after the remotely placed  old near stent with a 3.5 x 15 mm XIENCE stent postdilated 3.8 mm, and  tandem 95-90% mid stenoses being dilated to 0% with a 3-0 x 28 mm XIENCE  stent postdilated 3.3 mm and a 70% lesion being reduced to 0% with  distally placed 2.75 x 15 mm XIENCE stent postdilated 3.0 mm.  Successful stenting of the left circumflex coronary artery with a 3.0 x  12 mm XIENCE stent postdilated 3.25 mm with the 80% stenosis being  reduced to 0%, done with bivalve routing/oral Plavix/intracoronary and  intravenous nitroglycerin.           ______________________________  Nicki Guadalajara, M.D.     TK/MEDQ  D:  05/04/2008  T:  05/05/2008  Job:  [id number]   cc:   Nestor Ramp, MD

## 2010-08-21 NOTE — Cardiovascular Report (Signed)
NAME:  Michael Morales, Michael Morales              ACCOUNT NO.:  [id number]   MEDICAL RECORD NO.:  [id number]          PATIENT TYPE:  INP   LOCATION:  4731                         FACILITY:  [facility]   PHYSICIAN:  Thomas Kelly, M.D.     DATE OF BIRTH:  12/15/1927   DATE OF PROCEDURE:  DATE OF DISCHARGE:                            CARDIAC CATHETERIZATION   INDICATIONS:  Michael Morales is a 77-year-old gentleman who has a  history of known coronary artery disease.  He suffered an anterior wall  myocardial infarction and underwent remote stenting of his LAD by Dr.  Tysinger in December 2000.  He apparently was admitted to [facility] on May 01, 2008, with syncope and also intermittent second-  degree AV block for which, his beta-blocker was held.  He is now  referred for 2-D echo Doppler study to assess CAD progression.   PROCEDURE:  After premedication with Valium 4 mg intravenously, the  patient was prepped and draped in usual fashion.  His right femoral  artery was punctured anteriorly and a 5-French sheath was inserted  without difficulty.  Diagnostic cardiac catheterization was done  utilizing 5-French Judkins for left and right coronary catheters.  Intracoronary nitroglycerin 200 mcg was selectively administered down  into the right coronary artery.  The right catheter was also used for  selective angiography into the left subclavian internal mammary artery  system in the event CBG surgery is necessary.  A 5-French pigtail  catheter was used for biplane cineventriculography as well as distal  aortography.  Hemostasis was obtained by direct manual pressure.  The  patient tolerated the procedure well.   HEMODYNAMIC DATA:  Central aortic pressure was 160/85.  Left ventricular  pressure 160/17.   ANGIOGRAPHIC DATA:  The left main coronary artery was angiographically  normal and bifurcated into an LAD and left circumflex system.   The previously placed stent in the proximal LAD was  patent.  There was  mild intimal hyperplasia of 20% within the stented segment.  Just after  the stent, the LAD gave rise to the first diagonal and septal  perforating artery.  There was 80% narrowing in the LAD beyond the  septal perforating artery.  After the second diagonal vessel, there was  90% LAD stenosis.  At the third diagonal vessel, there was 70-80%  narrowing.   The left circumflex vessel gave rise to a very high marginal almost  ramus intermediate like vessel as well as an OM-2 and ended in several  small posterolateral branches.  The high marginal almost ramus  intermediate like vessel seemed to have ostial narrowing of at least  50%.  These proximal circumflex had 60% narrowing.  The marginal OM-2  vessel had diffuse 43% narrowing.   The right coronary artery had 50-60% proximal stenosis not significantly  benefited by IC nitroglycerin administration.   The left subclavian and internal mammary artery was free of significant  disease.   Biplane cineventriculography revealed moderate LV dysfunction with an  ejection fraction of approximately 35-40% with anterolateral  hypocontractility on the RAO projection.  There was very mild septal    hypocontractility on the LAO projection and ejection fraction appeared  at least 40% in this view.   Distal aortography did not demonstrate any renal artery stenosis.  There  was no significant aortoiliac disease.   IMPRESSION:  1. Moderate left ventricular dysfunction with anterolateral      hypocontractility and an ejection fraction of 35-40% in this      patient status post remote anterior wall myocardial infarction      treated with percutaneous transluminal coronary angioplasty      stenting of his left anterior descending coronary artery in      December 2000.  2. Multivessel coronary artery disease with patent proximal left      anterior descending coronary artery stent from December 2000 with      mild 20% intimal  hyperplasia within the stented segment, but with 3      lesions of 80, 90, and 70% in the left anterior descending coronary      artery at the first diagonal, second diagonal, and third diagonal      vessels.  3. 60% proximal circumflex stenosis with 50% ostial narrowing in the      obtuse marginal one branch of the circumflex and 40-50% narrowing      in the obtuse marginal two vessel.  4. 50-60% proximal somewhat eccentric right coronary artery stenosis,      not improved following intracoronary nitroglycerin administration.   DISCUSSION:  Michael Morales has moderate LV dysfunction from prior  myocardial infarction.  His stent from 2000 is widely patent.  He does  have at least 3 lesions in the LAD and has progressed disease involving  the circumflex and RCA.  Cineangiograms will be reviewed with  colleagues.  Prior to final recommendation of attempt at percutaneous  coronary intervention versus CABG revascularization surgery.           ______________________________  Thomas Kelly, M.D.     TK/MEDQ  D:  05/02/2008  T:  05/03/2008  Job:  [id number]   cc:   Sara L Neal, MD  Matthew Olson, MD  Jens Eichhorn, MD 

## 2010-08-21 NOTE — Discharge Summary (Signed)
[name], [name] NO.:  [id number]   MEDICAL RECORD NO.:  [id number]          PATIENT TYPE:  INP   LOCATION:  2502                         FACILITY:  [facility]   PHYSICIAN:  Michael Morales, M.D.DATE OF BIRTH:  09-01-1927   DATE OF ADMISSION:  04/30/2008  DATE OF DISCHARGE:  05/06/2008                               DISCHARGE SUMMARY   PRIMARY CARE Michael Morales:  Michael Belling, MD.   Consults: Cardiology   DISCHARGE DIAGNOSES:  1. Coronary artery disease s/p PCI.  2. Hypertension.  3. Hyperlipidemia.  4. Systolic CHF EF 35%  5. Syncope   DISCHARGE MEDICATIONS:  1. Plavix 75 mg one tablet daily.  2. Aspirin 81 mg once daily.  3. Metoprolol 25 mg twice daily.  4. Imdur 60 mg once daily.  5. Norvasc 5 mg once daily.  6. Lisinopril 40 mg once daily.  7. Simvastatin 80 mg once daily.   Procedures:  1. echo was done on January 25, which showed mild dilatation of the      left ventricle, moderately decreased systolic function of the left      ventricle, EF estimated to be 35%, moderate hypokinesis of mid      distal anteroseptal and anterior wall, increased relative      contribution of atrial contractions and LV filling, mild diastolic      dysfunction, aortic valve thickness mildly increased, mild mitral      annular calcification, and left atrial size at upper limits of      normal.  2. A diagnostic cath from May 02, 2008, showed that [name]      had moderate left ventricular dysfunction from prior MI with a 2000      stent that was widely patent through at least 3 lesions in the LAD,      progressed disease involving circumflex and RCA.  3. Cardiology debated PCI versus CABG and undertook the PCI on the      January 27th.  PCI was successful with placement of 4 stents over 5      lesions in the LAD and LCA.   LABORATORY DATA:  On day of admit on the 23rd, the patient POC cardiac  markers were negative.  On January 23, and 24, 2010, three sets  of  cardiac enzymes were all negative.  TSH done was within normal limits.  A lipid profile showed an LDL of 91, HDL of 26, triglycerides of 88.  BMET from admission showed an elevated creatinine of 1.76, elevated  glucose of 117.  By day of discharge, creatinine had gone down to 1.19  and glucose was down to 104.  CBC both on admit and discharge were  within normal limits, normal hemoglobin, normal hematocrit, normal white  cell count, and normal platelets.  BNP was elevated at 113.   REASON FOR ADMISSION:  [name] is a 77 year old gentleman with a  history of known cardiac disease.  He suffered an anterior wall MI and  underwent remote stenting of his LAD in December 2000.  He had a  syncopal episode the morning of admission  while working on a tractor  with a friend.  The patient's legs, according to the friend, crumbled  underneath him and the friend caught him and lowered him to the ground.  He was unresponsive for about 15 minutes with some grunting and then  awoke but then had another syncopal episode and was evidently  unconscious, when the EMS arrived.  The patient again denies and does  not remember any of this.  He denies pain in his chest.  He denies  shortness of breath, dizziness, nausea, vomiting, lightheadedness,  numbness, tingling, vision changes, and palpitations.  There was no  jerking.  No postictal state.  No bowel or bladder incontinence.   Hospital Course:  1. Syncope - Patient did not have any more syncopal episodes during      his hospital stay. CEs were negative initially and EKG seemed      unchanged compared to ones done many years prior. He did have a      widened PR interval but same as on prior EKGs. He was continued on      Toprol XL 50 mg his home dose and monitored to see if he would have      any events. He was placed on telemetry to rule out arrhythmias.      Telemetry initially showed that he was in trigeminy. Cardiology was      consulted the  morning after admission and patient started to have      2:1 block and held his Toprol. Given his hx of MI with prior stent      placement and that he evidently never had symptoms with his prior      MI, decision was made for catheterization. A diagnostic Cardiac      cath showed that the patient had multivessel disease.  At which      point, Cardiology decided to undergo PCI - at least 4 stents were      placed. Cardiology is going to set him up with an event monitor      after discharge.   1. HTN - Initially patient was placed on his home dose of Toprol XL      but given his new heart block, this was discontinued by cards. He      was also continued on his home dose of Lisinopril. His Lisinopril      was increased to 40 mg prior to discharge. He was also placed on      Norvasc 5 mg and metoprolol was started back at 25 mg BID by cards      on day of discharge. He did get all his BP medications prior to DC      and was able to walk well without any dizziness. He will need his      BP rechecked in clinic at follow up to ensure that his blood      pressures are not too low. Could consider stoping Norvasc if on the      lower side.   1. CAD - Had diagnostic cath and then cath for PCI - 4 stents palced.      He was placed on Imdur 30 mg by cards and placed on a nitro drip      after his PCI. This was quickly discontinued and he was then placed      on Imdur 60 mg at day of D/C and metropolol 25 mg BID. He was also  started on Plavix. His aspirin was continued. Initially at 325 mg      by cards and then discharged on 81 mg daily. He has been given info      regarding MAP and help for paying for Plavix.  [name] was also      given a 50-month supply of Plavix 75 mg daily from a supply at the      [facility].   1. Hyperlipidemia - Patient was taking Pravachol at home but had LDL      of 91 here. Goal is <60 given his mulitvessel CAD. He was started      on Zocor  80 mg daily.   1. Systolic CHF- Likely related in part to his CAD. Will go home with      Lisinopril and metoprolol and Imdur. EF was 35% on echo this      hospitalization.   1. Renal insufficiency - Initially Cr was 1.76 but this went down to      1.19 on discharge despite 2 cardiac catheterizations.   DISCHARGE INSTRUCTIONS:  The patient is advised to refrain from  strenuous activity such as lifting, pushing, or pulling, or distant  walking for a week.  The patient is also advised to follow up with his  cardiologist on February 8, and with his primary care Teddie Curd on  February 17.  These appointments have been made for him.  He has also  been given information to help pay for his Plavix through [facility]  Medication Assistance Program and will be sent home with his  prescriptions and with a card that will give him 2 weeks of Plavix until  he can get the Medication Assistance Program to help him.   FOLLOWUP:  The patient was advised by Cardiology to have an event  monitor presumably after he visits their office next week.  He is also  going to undergo cardiac rehab and because of all of the changes to his  hypertension regimen, we advised evaluation of his blood pressure at his  next visit.  The patient came in on lisinopril 40 mg daily and Toprol 50  mg daily and will be discharged on metoprolol 25 mg b.i.d., Imdur 60 mg  b.i.d., Norvasc 5 mg b.i.d. and lisinopril 40 mg b.i.d.  He is being  walked down the hallway prior to admission to ensure that he does not  have any dizziness, and he has received all medications prior to  discharge.  We just would like the Cardiology to be aware of the  significant changes in his regimen.   DISCHARGE CONDITION:  Michael Morales is improved.      Alanda Amass, M.D.  Electronically Signed      Michael Morales, M.D.  Electronically Signed    JH/MEDQ  D:  05/06/2008  T:  05/07/2008  Job:  [id number]

## 2010-08-21 NOTE — Cardiovascular Report (Signed)
NAME:  Michael Morales, Michael Morales NO.:  [id number]   MEDICAL RECORD NO.:  [id number]          PATIENT TYPE:  INP   LOCATION:  4731                         FACILITY:  [facility]   PHYSICIAN:  Nicki Guadalajara, M.D.     DATE OF BIRTH:  1928-02-11   DATE OF PROCEDURE:  DATE OF DISCHARGE:                            CARDIAC CATHETERIZATION   INDICATIONS:  Michael Morales is a 77 year old gentleman who has a  history of known coronary artery disease.  He suffered an anterior wall  myocardial infarction and underwent remote stenting of his LAD by Dr.  Aleen Campi in December 2000.  He apparently was admitted to [facility] on May 01, 2008, with syncope and also intermittent second-  degree AV block for which, his beta-blocker was held.  He is now  referred for 2-D echo Doppler study to assess CAD progression.   PROCEDURE:  After premedication with Valium 4 mg intravenously, the  patient was prepped and draped in usual fashion.  His right femoral  artery was punctured anteriorly and a 5-French sheath was inserted  without difficulty.  Diagnostic cardiac catheterization was done  utilizing 5-French Judkins for left and right coronary catheters.  Intracoronary nitroglycerin 200 mcg was selectively administered down  into the right coronary artery.  The right catheter was also used for  selective angiography into the left subclavian internal mammary artery  system in the event CBG surgery is necessary.  A 5-French pigtail  catheter was used for biplane cineventriculography as well as distal  aortography.  Hemostasis was obtained by direct manual pressure.  The  patient tolerated the procedure well.   HEMODYNAMIC DATA:  Central aortic pressure was 160/85.  Left ventricular  pressure 160/17.   ANGIOGRAPHIC DATA:  The left main coronary artery was angiographically  normal and bifurcated into an LAD and left circumflex system.   The previously placed stent in the proximal LAD was  patent.  There was  mild intimal hyperplasia of 20% within the stented segment.  Just after  the stent, the LAD gave rise to the first diagonal and septal  perforating artery.  There was 80% narrowing in the LAD beyond the  septal perforating artery.  After the second diagonal vessel, there was  90% LAD stenosis.  At the third diagonal vessel, there was 70-80%  narrowing.   The left circumflex vessel gave rise to a very high marginal almost  ramus intermediate like vessel as well as an OM-2 and ended in several  small posterolateral branches.  The high marginal almost ramus  intermediate like vessel seemed to have ostial narrowing of at least  50%.  These proximal circumflex had 60% narrowing.  The marginal OM-2  vessel had diffuse 43% narrowing.   The right coronary artery had 50-60% proximal stenosis not significantly  benefited by IC nitroglycerin administration.   The left subclavian and internal mammary artery was free of significant  disease.   Biplane cineventriculography revealed moderate LV dysfunction with an  ejection fraction of approximately 35-40% with anterolateral  hypocontractility on the RAO projection.  There was very mild septal  hypocontractility on the LAO projection and ejection fraction appeared  at least 40% in this view.   Distal aortography did not demonstrate any renal artery stenosis.  There  was no significant aortoiliac disease.   IMPRESSION:  1. Moderate left ventricular dysfunction with anterolateral      hypocontractility and an ejection fraction of 35-40% in this      patient status post remote anterior wall myocardial infarction      treated with percutaneous transluminal coronary angioplasty      stenting of his left anterior descending coronary artery in      December 2000.  2. Multivessel coronary artery disease with patent proximal left      anterior descending coronary artery stent from December 2000 with      mild 20% intimal  hyperplasia within the stented segment, but with 3      lesions of 80, 90, and 70% in the left anterior descending coronary      artery at the first diagonal, second diagonal, and third diagonal      vessels.  3. 60% proximal circumflex stenosis with 50% ostial narrowing in the      obtuse marginal one branch of the circumflex and 40-50% narrowing      in the obtuse marginal two vessel.  4. 50-60% proximal somewhat eccentric right coronary artery stenosis,      not improved following intracoronary nitroglycerin administration.   DISCUSSION:  Michael Morales has moderate LV dysfunction from prior  myocardial infarction.  His stent from 2000 is widely patent.  He does  have at least 3 lesions in the LAD and has progressed disease involving  the circumflex and RCA.  Cineangiograms will be reviewed with  colleagues.  Prior to final recommendation of attempt at percutaneous  coronary intervention versus CABG revascularization surgery.           ______________________________  Nicki Guadalajara, M.D.     TK/MEDQ  D:  05/02/2008  T:  05/03/2008  Job:  [id number]   cc:   Nestor Ramp, MD  Romero Belling, MD  Sheliah Mends, MD

## 2010-08-21 NOTE — H&P (Signed)
NAME:  Michael Morales, Michael Morales NO.:  [id number]   MEDICAL RECORD NO.:  [id number]          PATIENT TYPE:  INP   LOCATION:  4731                         FACILITY:  [facility]   PHYSICIAN:  Nestor Ramp, MD        DATE OF BIRTH:  [birth year]   DATE OF ADMISSION:  04/30/2008  DATE OF DISCHARGE:                              HISTORY & PHYSICAL   PRIMARY CARE PHYSICIAN:  Romero Belling, MD, at the [facility].   CHIEF COMPLAINT:  Syncope.   HISTORY OF PRESENT ILLNESS:  The patient is a 77 year old Caucasian male  with a history of MI, status post stent, CHF, hypertension, and  hyperlipidemia, who is here status post syncopal episode that occurred  this morning.  He was working on a tractor at that time with his friends  and felt a little weak.  He remembers laying down for a bit and then  feeling better.  He does not remember his syncopal episode at all.  According to the ED physician at [facility], he took the story from the  patient's friend.  The patient's legs crumpled underneath this morning  and the friend caught him and lowered him to the ground.  He was  unresponsive x15 minutes with some grunting at times.  The friend called  the EMS.  The patient awoke and was alert and oriented and the friend  helped him to a chair.  However, he then had another syncopal episode.  He was evidently unconscious when EMS arrived according to the ED  physician, however, he was always breathing and did have a pulse.  The  patient does not remember any of this.  He says he feels fine and  wonders if we have the story correct.  The patient denies chest pain,  shortness of breath, dizziness, nausea, vomiting, lightheadedness,  numbness, tingling, or vision changes.  He also denied palpitations to  the ED physician at [facility].  According to the friend' story that the  ED physician obtained, the patient's eyes rolled back into his head when  he did have the syncopal  episode.  No jerking occurred.  There was not a  postictal state.  There is no bowel or bladder incontinence.  The  patient said that he had a cough and some congestion on Thursday and so  he had not eaten much and thinks this is why he felt that this morning.  Now, he is eating and drinking normally and feels fine.  No fevers.  He  says his cough is much improved.  He has not been seen by our office  since September 2008.  He says he was given a clean bill of health that  did not think he needed to be seen again.  He is taking half of the  Toprol now.  The patient is able to walk behind a lawn mower while  mowing his 1-acre yard without shortness of breath or chest pain.  He  says he does not see a cardiologist now.  According to the ED physician,  his head  CT was negative along with point-of-care cardiac enzymes.  Also, his EKG was unchanged from prior ones.   MEDICATIONS:  1. Pravachol 1 tablet p.o. nightly.  2. Toprol-XL 50 mg 1 tablet daily, he has 100-mg tablets, but splits      these in half.  3. Aspirin 81 mg 1 tablet p.o. daily.  4. Lisinopril 20 mg 1 tablet p.o. daily.   No known drug allergies.   PAST MEDICAL HISTORY:  The patient had an a mini-mental status exam that  was 26/30 in August 2006, today it is 24/30.  He does have congestive  heart failure that is systolic, 40% EF.  He has a history of coronary  artery disease and a MI with stents placed in 09/10/1999.  He also has  hyperlipidemia and hypertension.   PAST SURGICAL HISTORY:  Just notable for cardiac catheterization for  total occlusion of the LAD and stent placement in Sep 10, 1999.  An exercise  stress test 4 weeks after his MI showed no ischemic changes and he had a  good prognosis by a test.  Cardiolite test was done in February 2005 and  showed him to be low risk   FAMILY HISTORY:  Dad died of an MI at 34 years old.  Mom died in September 10, 1994  from an unknown cause.  Siblings, brother has hyperlipidemia and  diabetes.  The  patient does not have any children.   SOCIAL HISTORY:  The patient is retired.  He worked at [employer], doing book work.  He is recently widowed in 09/10/06.  He lives  alone.  He has no natural children, 1 stepdaughter.  He says he only  smoked 4 days of his life.  No alcohol.  No drugs.  He keeps all his  finances and also drives a car and manages his house.   PHYSICAL EXAMINATION:  VITAL SIGNS:  Temperature 97.6, pulse rate 62,  respiratory rate 18, blood pressure 167/80, and satting 100% on room  air.  GENERAL:  He is alert, well developed, and well nourished.  HEAD:  Normocephalic and atraumatic.  EYES:  Pupils are equal, round, and reactive to light.  Extraocular  muscles are intact.  Mouth, oral mucosa, and oropharynx without lesions  or exudates.  NECK:  Supple.  No masses, no thyromegaly, and no cervical  lymphadenopathy.  LUNGS:  Normal respiratory effort.  No accessory muscle use.  Normal  breath sounds.  No crackles and no wheezes.  HEART:  Regular rate and rhythm.  No murmurs, rubs, or gallops.  Heart  sounds are distant.  No JVD.  No carotid bruits.  ABDOMEN:  Soft and nontender.  Normal bowel sounds.  No distention.  No  masses.  No guarding.  No hepatosplenomegaly.  MUSCULOSKELETAL:  Moving all extremities well.  Pulses right and left  radial normal.  EXTREMITIES:  No edema.  NEUROLOGIC:  Alert and oriented x3.  Cranial nerves II-XII intact.  Strength normal in all extremities.  His Romberg was negative.  He was  able to stand up, however, he did have a bunch of IV lines attached to  him which we had difficult.  SKIN:  Intact without suspicious lesions or rashes.  PSYCH:  Mini-mental status exam 24/30 today.  He is alert and oriented  x3.   LABORATORY DATA AND STUDIES:  BNP was 39.6.  Point of care and cardiac  enzymes were negative.  BMET showed sodium is slightly low at 133,  potassium 4, chloride 101,  bicarb 26, BUN 22, creatinine 1.76, glucose  117, and  GFR 38.  CBC:  White blood cell count 9.3, hemoglobin 14,  hematocrit 42.3, and platelets 195.  Head CT negative for acute process,  atrophy and some nonspecific white matter changes. bilateral maxillary  and ethmoid sinus disease.  Chest x-ray, mild cardiomegaly, no fluids,  no infiltrates.   ASSESSMENT AND PLAN:  The patient is a 77 year old Caucasian male with a  history of myocardial infarction, status post stent, congestive heart  failure, hypertension, and hyperlipidemia here with syncopal episode.  1. Syncope.  Unclear etiology, could be due to bradycardia or other      arrhythmia.  Head CT was negative and no indication of      cerebrovascular accident or transient ischemic attack. Also, no      chest pain or shortness of breath or EKG changes to indicate      myocardial infarction.  We will go ahead and cycle cardiac enzymes      x3, keep on telemetry to monitor, check EKG now and in the a.m.,      also check a TSH, observe the patient overnight on his home meds to      see if this occurred again, consult cardiology in the morning.  2. Hypertension.  We will continue below medications which include      Toprol-XL 100 mg take half a tablet daily and lisinopril 20 mg      daily.  We will continue his Toprol 50 to see if we could figure      out what happened this morning.  His Toprol could be preventing      arrhythmias from occurring or it also could be causing bradycardia.      Since we are able to safely monitor him in the hospital on this      dose, he will do so.  3. Hyperlipidemia.  Continue Pravachol, check a fasting lipid panel in      the morning as it has been over a year since he had this checked.  4. Coronary artery disease.  Continue below medications, cycle cardiac      enzymes, check EKG.  No angina noted.  5. Congestive heart failure, systolic.  The patient is doing well.  He      is not on digoxin, he was on this in the past, but is doing fine      without it.   His EF is 40%.  He has no pulmonary congestion on      chest x-ray.  He will continue on his beta-blocker, aspirin, and      lisinopril.  6. Renal insufficiency.  Creatinine is currently 1.76.  It was last      checked in the clinic a year ago and was 1.22.  We will consider      stopping lisinopril and check BMET in the morning.  7. Prophylaxis.  We will give heparin 5000 units 3 times a day to      prevent DVT.  8. Disposition.  Pending cardiology consult, monitoring, and ruling      out for MI.  Also, the patient is full code.  I did discuss this      with him today.  We will also try to wean his oxygen off as he      likely does not need this.      Alanda Amass, M.D.  Electronically Signed      Roxine Caddy  Jennette Kettle, MD  Electronically Signed    JH/MEDQ  D:  04/30/2008  T:  05/01/2008  Job:  [id number]

## 2010-08-24 NOTE — Cardiovascular Report (Signed)
[place]. [facility]  Patient:    Michael Morales                      MRN: [id number] Proc. Date: 03/09/99 Adm. Date:  [id number] Attending:  Sanjuana Letters CC:         Cardiac Catheterization Laboratory             [facility] Staff                        Cardiac Catheterization  PROCEDURE:  Cardiac catheterization and angioplasty.  CARDIOLOGIST:  Aram Candela. Aleen Campi, M.D.  INDICATIONS:  This 77 year old male was admitted on March 08, 1999, with severe anterior chest pain which was relieved, and did not cause acute electrocardiogram changes.  He was held in the emergency room overnight, because of lack of monitored beds for admission.  Follow-up enzymes were markedly positive for an acute myocardial infarction, and during the night his monitor showed several episodes of ventricular tachycardia.  DESCRIPTION OF PROCEDURE:  After signing an informed consent, the patient was premedicated with 50 mg of Benadryl intravenously and brought to the cardiac catheterization laboratory.  His right groin was prepped and draped in a sterile fashion and anesthetized locally with 1% lidocaine.  A 6-French introducer sheath was inserted percutaneously into the right femoral artery.  The 6-French #4 Judkins coronary catheters were used to make injections into the coronary arteries. The right coronary catheter was used to make an injection into the left subclavian artery, visualizing the left internal mammary artery.  A 6-French pigtail catheter was used to measure pressures in the left ventricle and aorta, and to make a mid-stream injection into the left ventricle.  After noting a total occlusion of his proximal LAD and fair collateral circulation to the mid and distal segments, and branches of the LAD system, we elected to proceed with angioplasty of the LAD. We first selected a 6-French JL4 guide catheter which was used to  make injections again into the left coronary artery.  We then selected a short Hi-Torque floppy  guide wire which was advanced through the guide wire into the left coronary artery. With moderate difficulty, we were able to pass this guide wire into the LAD, and through the totally-occluded lesion, and advance into the distal LAD.  This did not open the artery or cause antegrade flow.  We then selected a 3.0 x 20.0 mm balloon catheter which was an ADANTE balloon catheter, and after proper preparation this balloon catheter was advanced over the guide wire, and positioned within the totally-occluded lesion.  Two inflations were made, first at 2 atmospheres for 4 seconds, and the second at 3 atmospheres for 14 seconds.  These two small inflations opened the artery slightly, and allowed antegrade flow.  There was very slow flow into the middle and distal LAD.  We then were able to characterize the lesion and select an appropriate stent which was a 3.5 mm x 18.0 mm NIR Royale stent deployment system.  After proper preparation, this NIR Royale stent was advanced over the guide wire and positioned within the lesion.  The stent was then deployed with two inflations, the first at 16 atmospheres for 29 seconds, and the second at 20 atmospheres for 29 seconds.  After this second inflation, the balloon catheter was removed and injections again into the left coronary artery showed n excellent angiographic result with 0% residual lesion,  and re-establishment of TIMI-3 antegrade flow.  The patient tolerated the procedure well and no complications were noted at the end of the procedure.  The catheter and sheath ere removed from the right femoral artery and hemostasis was easily obtained with a  Perclose closure system.  MEDICATIONS GIVEN:  Heparin 2000 units IV, ReoPro drip per the pharmacy protocol, morphine 1 mg IV, Versed 1 mg IV.  HEMODYNAMIC DATA: Left ventricular blood pressure:   Initially was 89/5-17. Aortic pressure:  89/60-71. Post-angioplasty blood pressure:  The blood pressure rose and had a left ventricular pressure of 111/10-31. Post-angioplasty aortic pressure:  111/70, with a mean of 90. Left ventricular ejection fraction:  Estimated at between 30%-40%.  CINE FINDINGS: CORONARY CINE ANGIOGRAPHY: 1. Left coronary artery:  The ostium and left main appear normal.  In one    view there is a very mild plaque, which is nonobstructive. 2. Left anterior descending coronary artery:  The LAD is totally occluded near    its origin, with a mild 20% focal lesion at its origin, followed by a    very short normal segment, and then total occlusion before the first    septal and before the first diagonal branches.  The middle and distal    segment fill late and inadequately fill during injections into the left    coronary artery, and also during injections into the right coronary artery. 3. Circumflex coronary artery:  The circumflex has mild irregularities in its    proximal segment with a focal eccentric 20%-30% stenosis just distal to the    first obtuse marginal branch.  This first obtuse marginal branch is a large    branch which has a 50% ostial lesion.  The circumflex is a large vessel    which supplies the majority of the posterolateral circulation to the left    ventricle. 4. Right coronary artery:  The right coronary artery is a medium-sized vessel    which appears to be essentially normal, and supplies the posterior    descending circulation.  There was collateral circulation to several septal    branches during the right coronary injections.  The right coronary artery    is normal. 5. Left internal mammary artery:  Appears normal.  LEFT VENTRICULAR CINE ANGIOGRAM:  The left ventricle is mildly enlarged.  The overall left ventricular function is decreased, with an ejection fraction that as estimated at between 30%-40%.  The inferobasilar segment has  normal contractility, and the anterobasilar segment is normal.  The anterior segment has severe hypokinesia, and the apex, including the anteroapical area and inferoapical area has severe hypokinesia.  The mitral and aortic valves appear normal.   ANGIOPLASTY PROCEDURE: CINES:  Cines taken during the angioplasty procedure show proper positioning of the guide wire through the totally-occluded lesion, with failure of antegrade flow.  Further cines show proper positioning of the initial balloon dilation within the lesion, resulting in partial reconstitution of antegrade flow, and also showing a severe 95% focal lesion in the distal LAD.  Further cines show proper positioning of the stent deployment system, and proper deployment of the stent within the lesion.  Further cines show a very good angiographic result post-stent deployment, and final injections show a total relief of the distal 95% focal stenosis which  probably was due to spasm.  There was re-establishment of normal TIMI-3 antegrade flow.  FINAL DIAGNOSES: 1. Acute anterior myocardial infarction. 2. Total occlusion of the proximal left anterior descending coronary artery. 3. Moderate to  severe left ventricular dysfunction. 4. A 50% lesion in the large first obtuse marginal branch. 5. Normal left internal mammary artery. 6. Successful angioplasty with a percutaneous transluminal coronary angioplasty  and stent placement in the proximal left anterior descending coronary artery. 7. Successful Perclose of the right femoral artery.  DISPOSITION:  Will continue with plans to admit to the coronary care unit and continue serial enzymes and electrocardiograms.  Will notify the house staff of his findings and results. DD:  03/09/99 TD:  03/12/99 Job: [id number]

## 2010-08-24 NOTE — Consult Note (Signed)
. [facility]  Patient:    Michael Morales                      MRN: [id number] Proc. Date: 03/08/99 Adm. Date:  [id number] Attending:  Lorre Nick             1992Karin Duffy, P.A.                          Consultation Report  HISTORY OF PRESENT ILLNESS:  I was asked to see this 77 year old man, who was admitted to the family practice teaching service in pulmonary edema.  The patient presented this morning with severe shortness of breath.  He gives a  history of rather chronic dyspnea on exertion over the past five years or so. e says that about two weeks ago he had to stop working because the dyspnea on exertion was so much worse.  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 Subsequently, he has developed dyspnea which has awakened him from sleep, and has had increased frequency of nocturia, up to three to four times a night.  When he has been very short of breath, he has had a tight feeling in his chest.  He has had no chest pain otherwise, and denies palpitations, nausea, or chest pain with exertion.  He has also not noticed any ankle swelling.  PAST MEDICAL HISTORY:  His only medical care over the past few years has been in relationship to his job as a [employer].  He has received periodic physical evaluations from contract physicians, and has apparently been told that there was no identifiable problem.  He has no known history of hypertension or diabetes. He has never had a major operation.  CURRENT MEDICATIONS:  Aspirin.  Vitamin E.  Occasional vitamin C.  FAMILY HISTORY:  His parents died of strokes.  His family history is otherwise unknown.  SOCIAL HISTORY:  He is a still fairly active truck driver.  He lives in [place] with his wife.  REVIEW OF SYSTEMS:  CONSTITUTIONAL:  He denies fevers, chills, or sweats, or edema or claudication.  HEENT:  Eyes, no diplopia or blurring.  He has noticed that he has  better distant than near vision.  ENT, no deafness or dizziness. CARDIOVASCULAR:  See the history of present illness.  RESPIRATORY:  No cough or  wheezing.  He says that he smoked in elementary school in 1949, but not since.  GASTROINTESTINAL:  No dysphagia or heartburn.  GENITOURINARY:  Nocturia x 4-5 as noted.  No dysuria or pyuria.  MUSCULOSKELETAL:  No swollen joint or myalgias.  SKIN:  No rash or nodule.  BREASTS:  No rash or nodule.  NEUROLOGIC:  No faintness, syncope, or seizure.  ENDOCRINE:  No known diabetes or thyroid disease.  ______ : No swelling in the neck, axilla, or groin or easy bruising.  ALLERGIC LYMPHATIC: No known drug allergy.  PHYSICAL EXAMINATION:  VITAL SIGNS:  On admission, blood pressure 162/102, pulse of 106, temperature 95.8, respirations 24 on a 15-liter face mask.  His heart rate has now dropped to the mid 80s.  GENERAL:  Well-developed, well-nourished, elderly man whose dyspnea is much improved now, following Lasix therapy.  He is oriented to person, place, and time. His mood and affect are appropriate.  HEENT:  Conjunctivae and lids reveal no xanthelasma or icterus.  His teeth are is own and in fair repair,  with a number of snags.  The gums and palate now, on oxygen, reveal no pallor or cyanosis.  NECK:  Supple and symmetrical.  His trachea is midline and mobile.  There is no  palpable thyromegaly or cervical node, no carotid bruit or JVD.  CHEST:  His respiratory effort is somewhat increased, but without intercostal retractions.  LUNGS:  Scattered rales now.  BACK:  Straight.  There is no kyphosis or scoliosis.  NEUROLOGIC:  Gait is not tested.  His ability to undergo a stress test would be  limited, given the considerations noted above.  Muscle strength and tone are appropriate to his age.  The cardiac apical impulse is vaguely felt at the left  anterior axillary line in the fifth or sixth intercostal space.  HEART:  Rhythm is  regular, and the rate is in the upper 80s.  There is a prominent gallop sound, presumably a third sound.  There is no murmur.  SKIN:  Digits and nails reveal no clubbing or cyanosis.  Skin and subcutaneous tissue reveals no stasis dermatitis or ulcer.  ABDOMEN:  Mildly obese and nontender.  Bowel sounds are present.  The liver is ot palpably enlarged, nor is the spleen.  The abdominal aorta is not palpable, and  there is no bruit.  EXTREMITIES:  The femoral arteries are without bruits.  The posterior tibial pulses are normal.  The dorsalis pedis pulses are difficult to feel or or absent.  The  legs reveal no edema or varicosity.  RECTAL:  Per the attending service.  LABORATORY DATA:  Available laboratory data include a troponin I of 0.06.  CPK ith MBs were 143 unit total, 14.8 units of MB bands.  EKG shows sinus tachycardia, left intraventricular conduction delay with marked  left axis deviation and ST elevation in leads V1 and V2, probably consistent with left bundle branch block or an apical aneurysm.  A 2-D echocardiogram was done in the emergency room.  It shows severe left ventricular dysfunction with an ejection fraction estimated at 25%, fair contractility only in the posterior wall, and a large apical aneurysm.  Chest x-ray shows florid pulmonary edema.  This patient has developed severe pulmonary edema with a recent history of severe dyspnea on exertion and paroxysmal nocturnal dyspnea.  Surprisingly, he has little peripheral edema and no hepatomegaly.  The enzyme elevation today may well be due only to severe pulmonary edema with left ventricular strain following a prior myocardial infarction.  He has no chest pain now.  In order to optimize his therapy, he will require cardiac catheterization since his left ventricular function is so severe abnormal, and since the CPK-MB is at least consistent with, but not diagnostic of, potentially recurrent myocardial  ischemia.  Meanwhile, he will require vigorous diuresis, aggressive potassium replacement,  since his potassium was low on admission at 3.1 and, once the diagnostic issues   have been dealt with, a carefully coordinated program of ACE inhibitors and judicious beta blockers to both control heart rate and reduce peripheral vascular resistance. DD:  03/08/99 TD:  03/09/99 Job: [id number]

## 2010-08-30 ENCOUNTER — Other Ambulatory Visit: Payer: Self-pay | Admitting: Family Medicine

## 2010-08-30 NOTE — Telephone Encounter (Signed)
Refill request

## 2010-09-19 ENCOUNTER — Other Ambulatory Visit: Payer: Self-pay | Admitting: Family Medicine

## 2010-09-19 NOTE — Telephone Encounter (Signed)
Refill request

## 2010-10-01 ENCOUNTER — Other Ambulatory Visit: Payer: Self-pay | Admitting: Family Medicine

## 2010-10-01 NOTE — Telephone Encounter (Signed)
Refill request

## 2010-10-22 ENCOUNTER — Other Ambulatory Visit: Payer: Self-pay | Admitting: Family Medicine

## 2010-10-22 NOTE — Telephone Encounter (Signed)
Refill request

## 2010-10-31 ENCOUNTER — Other Ambulatory Visit: Payer: Self-pay | Admitting: Family Medicine

## 2010-10-31 NOTE — Telephone Encounter (Signed)
Refill request

## 2010-11-02 ENCOUNTER — Other Ambulatory Visit: Payer: Self-pay | Admitting: Family Medicine

## 2010-11-02 MED ORDER — CLOPIDOGREL BISULFATE 75 MG PO TABS: 75.0000 mg | ORAL_TABLET | Freq: Every day | ORAL | Status: DC

## 2010-11-06 ENCOUNTER — Other Ambulatory Visit: Payer: Self-pay | Admitting: Family Medicine

## 2010-11-06 MED ORDER — CLOPIDOGREL BISULFATE 75 MG PO TABS: 75.0000 mg | ORAL_TABLET | Freq: Every day | ORAL | Status: DC

## 2010-11-17 ENCOUNTER — Other Ambulatory Visit: Payer: Self-pay | Admitting: Family Medicine

## 2010-11-18 NOTE — Telephone Encounter (Signed)
Refill request

## 2010-11-28 ENCOUNTER — Other Ambulatory Visit: Payer: Self-pay | Admitting: Family Medicine

## 2010-11-28 NOTE — Telephone Encounter (Signed)
Refill request

## 2010-12-19 ENCOUNTER — Other Ambulatory Visit: Payer: Self-pay | Admitting: Family Medicine

## 2010-12-19 NOTE — Telephone Encounter (Signed)
Refill request

## 2010-12-24 ENCOUNTER — Other Ambulatory Visit: Payer: Self-pay | Admitting: Family Medicine

## 2010-12-24 NOTE — Telephone Encounter (Signed)
Refill request

## 2011-01-02 ENCOUNTER — Other Ambulatory Visit: Payer: Self-pay | Admitting: Family Medicine

## 2011-01-02 NOTE — Telephone Encounter (Signed)
Refill request

## 2011-01-17 ENCOUNTER — Other Ambulatory Visit: Payer: Self-pay | Admitting: Family Medicine

## 2011-01-17 NOTE — Telephone Encounter (Signed)
Refill request

## 2011-01-22 ENCOUNTER — Other Ambulatory Visit: Payer: Self-pay | Admitting: Family Medicine

## 2011-01-22 NOTE — Telephone Encounter (Signed)
Refill request

## 2011-02-02 ENCOUNTER — Other Ambulatory Visit: Payer: Self-pay | Admitting: Family Medicine

## 2011-02-04 NOTE — Telephone Encounter (Signed)
Refill request

## 2011-02-12 ENCOUNTER — Encounter: Payer: Self-pay | Admitting: Home Health Services

## 2011-02-18 ENCOUNTER — Other Ambulatory Visit: Payer: Self-pay | Admitting: Family Medicine

## 2011-02-18 NOTE — Telephone Encounter (Signed)
Refill request

## 2011-02-21 ENCOUNTER — Other Ambulatory Visit: Payer: Self-pay | Admitting: Family Medicine

## 2011-02-21 ENCOUNTER — Ambulatory Visit: Payer: Medicare Other | Admitting: Home Health Services

## 2011-02-21 ENCOUNTER — Ambulatory Visit (INDEPENDENT_AMBULATORY_CARE_PROVIDER_SITE_OTHER): Payer: Medicare Other | Admitting: Home Health Services

## 2011-02-21 ENCOUNTER — Encounter: Payer: Self-pay | Admitting: Home Health Services

## 2011-02-21 VITALS — BP 111/85 | HR 57 | Temp 97.7°F | Ht 70.0 in | Wt 217.4 lb

## 2011-02-21 DIAGNOSIS — E785 Hyperlipidemia, unspecified: Secondary | ICD-10-CM

## 2011-02-21 DIAGNOSIS — Z0000 Encounter for general adult medical examination without abnormal findings: Secondary | ICD-10-CM

## 2011-02-21 DIAGNOSIS — Z23 Encounter for immunization: Secondary | ICD-10-CM

## 2011-02-21 NOTE — Patient Instructions (Signed)
1. Continue to work outside and keep walking for exercise.  2. Consider going to eye doctor for follow up. 3. Focus on eating 3-5 vegetables a day and whole grains.  4. Continue to balance your check book and reading.  5. Remain active with your church group.

## 2011-02-21 NOTE — Progress Notes (Signed)
Patient here for annual wellness visit, patient reports: Risk Factors/Conditions needing evaluation or treatment: Pt does not have any risk factors that need evaluation. Home Safety: Pt lives by self.  Pt reports having smoke detectors and adaptive equipment in bathroom. Other Information: Corrective lens: Pt wears reading glasses, does not go to optometrist.  Dentures: Pt has full upper dentures and does not wear them. Memory: Pt reports some memory problems.  Pt display some difficulty with recall and understanding. Patient's Mini Mental Score (recorded in doc. flowsheet): 28  Balance/Gait:  Balance Abnormal Patient value  Sitting balance    Sit to stand    Attempts to arise    Immediate standing balance    Standing balance    Nudge    Eyes closed- Romberg    Tandem stance    Back lean    Neck Rotation    360 degree turn x unable  Sitting down     Gait Abnormal Patient value  Initiation of gait    Step length-left    Step length-right    Step height-left    Step height-right    Step symmetry    Step continuity    Path deviation    Trunk movement x Little stiff   Walking stance x Hunched over      Annual Wellness Visit Requirements Recorded Today In  Medical, family, social history Past Medical, Family, Social History Section  Current providers Care team  Current medications Medications  Wt, BP, Ht, BMI Vital signs  Hearing assessment (welcome visit) Hearing/vision  Tobacco, alcohol, illicit drug use History  ADL Nurse Assessment  Depression Screening Nurse Assessment  Cognitive impairment Nurse Assessment  Mini Mental Status Document Flowsheet  Fall Risk Nurse Assessment  Home Safety Progress Note  End of Life Planning (welcome visit) Social Documentation  Medicare preventative services Progress Note  Risk factors/conditions needing evaluation/treatment Progress Note  Personalized health advice Patient Instructions, goals, letter  Diet & Exercise Social  Documentation  Emergency Contact Social Documentation  Seat Belts Social Documentation  Sun exposure/protection Social Documentation    Medicare Prevention Plan:   Recommended Medicare Prevention Screenings Men over 65 Test For Frequency Date of Last- BOLD if needed  Colorectal Cancer 3/11   Prostate Cancer Never or yearly 3/10  Aortic Aneurysm Once if 65-75 with hx of smoking Had stent put in  Cholesterol 5 yrs 7/11  Diabetes yearly 7/11  HIV yearly delcined  Influenza Shot yearly 11/12  Pneumonia Shot once 12/00  Zostavax Shot once

## 2011-02-21 NOTE — Telephone Encounter (Signed)
Refill request

## 2011-02-26 ENCOUNTER — Other Ambulatory Visit: Payer: Medicare Other

## 2011-02-26 DIAGNOSIS — E785 Hyperlipidemia, unspecified: Secondary | ICD-10-CM

## 2011-02-26 NOTE — Progress Notes (Signed)
CMP AND FLP DONE TODAY Nazariah Cadet 

## 2011-02-27 ENCOUNTER — Other Ambulatory Visit: Payer: Self-pay

## 2011-02-27 ENCOUNTER — Inpatient Hospital Stay (HOSPITAL_COMMUNITY)
Admission: EM | Admit: 2011-02-27 | Discharge: 2011-02-28 | DRG: 552 | Disposition: A | Discharge status: Home / Self Care | Payer: Medicare Other | Source: Ambulatory Visit | Attending: Family Medicine | Admitting: Family Medicine

## 2011-02-27 ENCOUNTER — Emergency Department (HOSPITAL_COMMUNITY): Payer: Medicare Other

## 2011-02-27 ENCOUNTER — Encounter (HOSPITAL_COMMUNITY): Payer: Self-pay | Admitting: *Deleted

## 2011-02-27 ENCOUNTER — Other Ambulatory Visit: Payer: Self-pay | Admitting: Family Medicine

## 2011-02-27 DIAGNOSIS — I2510 Atherosclerotic heart disease of native coronary artery without angina pectoris: Secondary | ICD-10-CM | POA: Diagnosis present

## 2011-02-27 DIAGNOSIS — I5022 Chronic systolic (congestive) heart failure: Secondary | ICD-10-CM | POA: Diagnosis present

## 2011-02-27 DIAGNOSIS — W19XXXA Unspecified fall, initial encounter: Secondary | ICD-10-CM | POA: Diagnosis present

## 2011-02-27 DIAGNOSIS — I252 Old myocardial infarction: Secondary | ICD-10-CM

## 2011-02-27 DIAGNOSIS — I509 Heart failure, unspecified: Secondary | ICD-10-CM | POA: Diagnosis present

## 2011-02-27 DIAGNOSIS — I219 Acute myocardial infarction, unspecified: Secondary | ICD-10-CM

## 2011-02-27 DIAGNOSIS — M48062 Spinal stenosis, lumbar region with neurogenic claudication: Principal | ICD-10-CM | POA: Diagnosis present

## 2011-02-27 DIAGNOSIS — S42293A Other displaced fracture of upper end of unspecified humerus, initial encounter for closed fracture: Secondary | ICD-10-CM | POA: Diagnosis present

## 2011-02-27 DIAGNOSIS — I10 Essential (primary) hypertension: Secondary | ICD-10-CM | POA: Diagnosis present

## 2011-02-27 DIAGNOSIS — R55 Syncope and collapse: Secondary | ICD-10-CM

## 2011-02-27 DIAGNOSIS — E785 Hyperlipidemia, unspecified: Secondary | ICD-10-CM | POA: Diagnosis present

## 2011-02-27 HISTORY — DX: Atherosclerotic heart disease of native coronary artery without angina pectoris: I25.10

## 2011-02-27 HISTORY — DX: Gastro-esophageal reflux disease without esophagitis: K21.9

## 2011-02-27 HISTORY — DX: Acute myocardial infarction, unspecified: I21.9

## 2011-02-27 LAB — COMPREHENSIVE METABOLIC PANEL
AST: 16 U/L (ref 0–37)
Albumin: 4.4 g/dL (ref 3.5–5.2)
Alkaline Phosphatase: 69 U/L (ref 39–117)
BUN: 9 mg/dL (ref 6–23)
Potassium: 3.8 mEq/L (ref 3.5–5.3)
Sodium: 142 mEq/L (ref 135–145)
Total Bilirubin: 0.4 mg/dL (ref 0.3–1.2)
Total Protein: 6.7 g/dL (ref 6.0–8.3)

## 2011-02-27 LAB — CBC
HCT: 41.1 % (ref 39.0–52.0)
MCH: 33.3 pg (ref 26.0–34.0)
MCH: 33.2 pg (ref 26.0–34.0)
MCHC: 34.1 g/dL (ref 30.0–36.0)
Platelets: 192 10*3/uL (ref 150–400)
RBC: 4.17 MIL/uL — ABNORMAL LOW (ref 4.22–5.81)
RDW: 13.3 % (ref 11.5–15.5)
RDW: 13.3 % (ref 11.5–15.5)
WBC: 7.1 10*3/uL (ref 4.0–10.5)

## 2011-02-27 LAB — CARDIAC PANEL(CRET KIN+CKTOT+MB+TROPI)
Relative Index: INVALID (ref 0.0–2.5)
Total CK: 91 U/L (ref 7–232)
Troponin I: <0.3 ng/mL (ref <0.30)

## 2011-02-27 LAB — LIPID PANEL
HDL: 37 mg/dL — ABNORMAL LOW (ref >39)
LDL Cholesterol: 59 mg/dL (ref 0–99)
Triglycerides: 67 mg/dL (ref <150)
VLDL: 13 mg/dL (ref 0–40)

## 2011-02-27 LAB — POCT I-STAT, CHEM 8
Chloride: 107 mEq/L (ref 96–112)
Creatinine, Ser: 1.1 mg/dL (ref 0.50–1.35)
Glucose, Bld: 129 mg/dL — ABNORMAL HIGH (ref 70–99)
Potassium: 4.5 mEq/L (ref 3.5–5.1)

## 2011-02-27 LAB — PROTIME-INR
INR: 1.05 (ref 0.00–1.49)
Prothrombin Time: 13.9 seconds (ref 11.6–15.2)

## 2011-02-27 LAB — BASIC METABOLIC PANEL
CO2: 26 mEq/L (ref 19–32)
Calcium: 9.6 mg/dL (ref 8.4–10.5)
GFR calc Af Amer: 71 mL/min — ABNORMAL LOW (ref >90)
Sodium: 142 mEq/L (ref 135–145)

## 2011-02-27 MED ORDER — ROSUVASTATIN CALCIUM 20 MG PO TABS
20.0000 mg | ORAL_TABLET | Freq: Every day | ORAL | Status: DC
  Administered 2011-02-27 – 2011-02-28 (×2): 20 mg via ORAL
  Filled 2011-02-27 (×2): qty 1

## 2011-02-27 MED ORDER — HEPARIN SODIUM (PORCINE) 5000 UNIT/ML IJ SOLN
5000.0000 [IU] | Freq: Three times a day (TID) | INTRAMUSCULAR | Status: DC
  Administered 2011-02-27 – 2011-02-28 (×3): 5000 [IU] via SUBCUTANEOUS
  Filled 2011-02-27 (×5): qty 1

## 2011-02-27 MED ORDER — ASPIRIN 81 MG PO TABS
81.0000 mg | ORAL_TABLET | Freq: Every day | ORAL | Status: DC
  Administered 2011-02-28: 81 mg via ORAL
  Filled 2011-02-27 (×3): qty 1

## 2011-02-27 MED ORDER — SODIUM CHLORIDE 0.9 % IJ SOLN: 3.0000 mL | INTRAMUSCULAR | Status: DC | PRN

## 2011-02-27 MED ORDER — SODIUM CHLORIDE 0.9 % IJ SOLN
3.0000 mL | Freq: Two times a day (BID) | INTRAMUSCULAR | Status: DC
  Administered 2011-02-27 – 2011-02-28 (×2): 3 mL via INTRAVENOUS

## 2011-02-27 MED ORDER — SODIUM CHLORIDE 0.9 % IV SOLN: 250.0000 mL | INTRAVENOUS | Status: DC

## 2011-02-27 MED ORDER — SODIUM CHLORIDE 0.9 % IV SOLN: INTRAVENOUS | Status: DC

## 2011-02-27 MED ORDER — CLOPIDOGREL BISULFATE 75 MG PO TABS
75.0000 mg | ORAL_TABLET | Freq: Every day | ORAL | Status: DC
  Administered 2011-02-27 – 2011-02-28 (×2): 75 mg via ORAL
  Filled 2011-02-27 (×2): qty 1

## 2011-02-27 NOTE — ED Notes (Signed)
Admitting MD at bedside.

## 2011-02-27 NOTE — ED Notes (Signed)
Assisted pt with use of urinal w/o incident. Pt c/o being hungry.  Sprite given.  No other verbal complaints at this time.

## 2011-02-27 NOTE — Progress Notes (Signed)
CRITICAL CARE Performed by: ZOXWR,UEAVW   Total critical care time: 55  Critical care time was exclusive of separately billable procedures and treating other patients.  Critical care was necessary to treat or prevent imminent or life-threatening deterioration.  Critical care was time spent personally by me on the following activities: development of treatment plan with patient and/or surrogate as well as nursing, discussions with consultants, evaluation of patient's response to treatment, examination of patient, obtaining history from patient or surrogate, ordering and performing treatments and interventions, ordering and review of laboratory studies, ordering and review of radiographic studies, pulse oximetry and re-evaluation of patient's condition.

## 2011-02-27 NOTE — ED Notes (Signed)
Tray ordered for pt.  Awaiting full admission orders for pt disposition.

## 2011-02-27 NOTE — Progress Notes (Signed)
CRITICAL CARE Performed by: Osker Ayoub   Total critical care time: 55 minutes  Critical care time was exclusive of separately billable procedures and treating other patients.  Critical care was necessary to treat or prevent imminent or life-threatening deterioration.  Critical care was time spent personally by me on the following activities: development of treatment plan with patient and/or surrogate as well as nursing, discussions with consultants, evaluation of patient's response to treatment, examination of patient, obtaining history from patient or surrogate, ordering and performing treatments and interventions, ordering and review of laboratory studies, ordering and review of radiographic studies, pulse oximetry and re-evaluation of patient's condition.  

## 2011-02-27 NOTE — ED Notes (Signed)
Pt moved from [facility] to room #25 yellow section.  Report given to Bonita Quin, RN and Lelon Mast, RN

## 2011-02-27 NOTE — Code Documentation (Signed)
77 yo male who was shopping at an auto parts store, when merchants noted him having difficulty walking as he exited the store.  They followed him and saw him collapse in the parking lot between 1400-1430. EMS was called and pt arrived at 1435 at  the ED alert, oriented and moving all 4s, but c/o L shoulder pain which he thinks he injured in the fall.  T EDP examined pt at 1545 and activated code strokeat that time d/t LUE and LLE weakness. Stroke team arrived at 1550 Pt was in CT at 1549 Labs were drawn at 1540 NIHSS is 0; there is no weakness of the distal LUE, but pt does have diff lifting his arm d/t pain. NO weakness of LLE noted. Code stroke was canceled at 1618 by Dr. Lyman Speller and recommendations d/w Dr. Preston Fleeting. ED RN aware of above findings.

## 2011-02-27 NOTE — ED Provider Notes (Signed)
History     CSN: [id number] Arrival date & time: 02/27/2011  2:35 PM   None     Chief Complaint  Patient presents with  . Near Syncope    dizzy, headache, nausea    (Consider location/radiation/quality/duration/timing/severity/associated sxs/prior treatment) Patient is a 77 y.o. male presenting with altered mental status. The history is provided by the patient.  Altered Mental Status This is a new problem. The current episode started today. The problem occurs rarely. The problem has been resolved. Pertinent negatives include no abdominal pain, chest pain, congestion, coughing, fever, headaches, nausea or vomiting. Associated symptoms comments: Felt dizzy. The symptoms are aggravated by standing. Treatments tried: tried to lay down, but was in store and end up falling over. The treatment provided no relief.    Past Medical History  Diagnosis Date  . Hyperlipidemia   . Hypertension   . Coronary artery disease     Past Surgical History  Procedure Date  . Coronary artery bypass graft   . Cardiac surgery     Family History  Problem Relation Age of Onset  . Stroke Father     History  Substance Use Topics  . Smoking status: Never Smoker   . Smokeless tobacco: Never Used  . Alcohol Use: No      Review of Systems  Constitutional: Negative for fever.  HENT: Negative for congestion.   Respiratory: Negative for cough and shortness of breath.   Cardiovascular: Negative for chest pain.  Gastrointestinal: Negative for nausea, vomiting, abdominal pain and diarrhea.  Neurological: Positive for dizziness. Negative for headaches.  Psychiatric/Behavioral: Positive for altered mental status.  All other systems reviewed and are negative.    Allergies  Review of patient's allergies indicates no known allergies.  Home Medications     BP 122/70  Pulse 62  Temp(Src) 97.6 F (36.4 C) (Oral)  Resp 18  SpO2 100%  Physical Exam  Nursing note and vitals  reviewed. Constitutional: He is oriented to person, place, and time. He appears well-developed and well-nourished. No distress.  HENT:  Head: Normocephalic and atraumatic.  Eyes: Pupils are equal, round, and reactive to light.  Cardiovascular: Normal rate and normal heart sounds.   Pulmonary/Chest: Effort normal and breath sounds normal. No respiratory distress.  Abdominal: Soft. He exhibits no distension. There is no tenderness.  Musculoskeletal: Normal range of motion.  Neurological: He is alert and oriented to person, place, and time. He exhibits normal muscle tone. GCS eye subscore is 4. GCS verbal subscore is 5. GCS motor subscore is 6.       NIH stroke scale 1   Mild 4/5 strength deficit noted on both left arm and left leg  Skin: Skin is warm and dry.     Psychiatric: He has a normal mood and affect.    ED Course  Procedures (including critical care time)  Ct Head Wo Contrast  02/27/2011  *RADIOLOGY REPORT*  Clinical Data: Near-syncope.  Code stroke.  CT HEAD WITHOUT CONTRAST  Technique:  Contiguous axial images were obtained from the base of the skull through the vertex without contrast.  Comparison: Head CT 04/30/2008.  Findings: The patient has a remote right PCA territory infarct. The brain is atrophic with some chronic microvascular ischemic change.  No evidence of acute infarction, hemorrhage, mass lesion, mass effect, midline shift or abnormal extra-axial fluid collection.  No hydrocephalus or pneumocephalus.  The calvarium is intact.  IMPRESSION:  1.  No acute finding. 2.  Atrophy, chronic microvascular ischemic change  and remote right PCA territory infarct. 3. Critical Value/emergent results were called by telephone at the time of interpretation on 02/27/2011  at 4:00 p.m.  to  Dr. Preston Fleeting, who verbally acknowledged these results.  Original Report Authenticated By: Bernadene Bell. Maricela Curet, M.D.   Results for orders placed during the hospital encounter of 02/27/11  CBC       Component Value Range   WBC 7.1  4.0 - 10.5 (K/uL)   RBC 4.17 (*) 4.22 - 5.81 (MIL/uL)   Hemoglobin 13.9  13.0 - 17.0 (g/dL)   HCT 16.1  09.6 - 04.5 (%)   MCV 96.9  78.0 - 100.0 (fL)   MCH 33.3  26.0 - 34.0 (pg)   MCHC 34.4  30.0 - 36.0 (g/dL)   RDW 40.9  81.1 - 91.4 (%)   Platelets 192  150 - 400 (K/uL)  BASIC METABOLIC PANEL      Component Value Range   Sodium 142  135 - 145 (mEq/L)   Potassium 4.6  3.5 - 5.1 (mEq/L)   Chloride 107  96 - 112 (mEq/L)   CO2 26  19 - 32 (mEq/L)   Glucose, Bld 121 (*) 70 - 99 (mg/dL)   BUN 10  6 - 23 (mg/dL)   Creatinine, Ser 7.82  0.50 - 1.35 (mg/dL)   Calcium 9.6  8.4 - 95.6 (mg/dL)   GFR calc non Af Amer 61 (*) >90 (mL/min)   GFR calc Af Amer 71 (*) >90 (mL/min)  POCT I-STAT, CHEM 8      Component Value Range   Sodium 144  135 - 145 (mEq/L)   Potassium 4.5  3.5 - 5.1 (mEq/L)   Chloride 107  96 - 112 (mEq/L)   BUN 11  6 - 23 (mg/dL)   Creatinine, Ser 2.13  0.50 - 1.35 (mg/dL)   Glucose, Bld 086 (*) 70 - 99 (mg/dL)   Calcium, Ion 5.78  4.69 - 1.32 (mmol/L)   TCO2 26  0 - 100 (mmol/L)   Hemoglobin 14.6  13.0 - 17.0 (g/dL)   HCT 62.9  52.8 - 41.3 (%)  APTT      Component Value Range   aPTT 28  24 - 37 (seconds)  PROTIME-INR      Component Value Range   Prothrombin Time 13.9  11.6 - 15.2 (seconds)   INR 1.05  0.00 - 1.49   CARDIAC PANEL(CRET KIN+CKTOT+MB+TROPI)      Component Value Range   Total CK 91  7 - 232 (U/L)   CK, MB 3.3  0.3 - 4.0 (ng/mL)   Troponin I <0.30  <0.30 (ng/mL)   Relative Index RELATIVE INDEX IS INVALID  0.0 - 2.5   CBC      Component Value Range   WBC 7.0  4.0 - 10.5 (K/uL)   RBC 4.22  4.22 - 5.81 (MIL/uL)   Hemoglobin 14.0  13.0 - 17.0 (g/dL)   HCT 24.4  01.0 - 27.2 (%)   MCV 97.4  78.0 - 100.0 (fL)   MCH 33.2  26.0 - 34.0 (pg)   MCHC 34.1  30.0 - 36.0 (g/dL)   RDW 53.6  64.4 - 03.4 (%)   Platelets 202  150 - 400 (K/uL)  GLUCOSE, CAPILLARY      Component Value Range   Glucose-Capillary 109 (*) 70 - 99  (mg/dL)     Date: 74/25/9563  Rate: 59  Rhythm: none available and unchanged  QRS Axis: normal  Intervals: PR prolonged  ST/T Wave  abnormalities: normal  Conduction Disutrbances:first-degree A-V block  and nonspecific intraventricular conduction delay  Narrative Interpretation:   Old EKG Reviewed: unchanged from 04/2808     1. Near syncope       MDM  3:06 PM Pt seen and examined. Pt states that he had not eaten lunch or taken the medicine he was due for and he became dizzy. He then fell, but remembers the whole episode. Now complaining of mild pain in left shoulder and left knee where he hit. CODE stroke activated due to time of onset of left side weakness at 2pm.  6:11 PM No acute findings on head CT or labs. Pt will be admitted to [facility] for further evaluation. Neurology left a consult note in computer with further recs including MRI/A of head  Daleen Bo 02/28/11 1610

## 2011-02-27 NOTE — Progress Notes (Signed)
77 year old male was at an auto parts store when he started feeling lightheaded and weak. He had to lean on the counter to keep his balance. He walked out the side and then fell and he states that he injured his left shoulder when he fell. He states he is back to normal now. This episode occurred at about 2 PM. On exam, he has some mild tenderness and pain on range of motion of his left shoulder, but he has significant weakness of his left arm and left leg with strength 4/5 in both his left arm and left leg. This seems to be distinctly separate from the pain that he has in his shoulder. There is also some cogwheel rigidity noted of the left arm. Code stroke is activated, although I doubt he is going to be a candidate for thrombolytic since his deficit is relatively mild, and NIH stroke scale would only be 1.

## 2011-02-27 NOTE — ED Notes (Signed)
Pt being transported to 4710 via stretcher , on monitor, with all personal belongings

## 2011-02-27 NOTE — ED Notes (Signed)
Pt A/O x4, NAD, resp equal and nonlabored, lungs CTA all fields.  Grips and pedal pushes equal.  No neuro deficits noted.  Face is symmetrical.  Speech is clear.  Pt can shrug shoulders, smile and raise eyebrows.

## 2011-02-27 NOTE — ED Notes (Signed)
Hospitalist at [facility] for admission evaluation.

## 2011-02-27 NOTE — ED Notes (Signed)
Pt now a code stroke and affects left side.  Called ct and to go to room 3

## 2011-02-27 NOTE — H&P (Signed)
[facility] Admission History and Physical  Patient name: Michael Morales Medical record number: [id number] Date of birth: [birth year] Age: 77 y.o. Gender: male  Primary Care Provider: Ellery Plunk, MD  Chief Complaint: Fall, weakness History of Present Illness: Michael Morales is a 77 y.o. year old male presenting after a fall in a store. Patient states he had been standing waiting for help in a store for roughly 30-60 minutes and when walking to leave the store his legs began to feel numb. Patient reports that his "feet wouldn't work right". He then fell on his left side. Patient reports left shoulder pain that occurred after fall, and landing on left shoulder. After fall patient was transported to ER by EMS. Patient denies any dizziness prior to fall. Patient denies LOC. Patient states that his symptoms resolved soon after the fall. Prior to this episode patient states that he was in his normal state of health.  No chest pain. No short of breath. No fever. No abdominal pain. No nausea /vomiting. No diaphoresis.  Patient Active Problem List  Diagnoses  . HYPERLIPIDEMIA  . HYPERTENSION  . MYOCARDIAL INFARCTION, OLD  . CORONARY ARTERY DISEASE  . CHRONIC SYSTOLIC HEART FAILURE  . CONSTIPATION   Past Medical History: Past Medical History  Diagnosis Date  . Hyperlipidemia   . Hypertension   . Coronary artery disease     Past Surgical History: Past Surgical History  Procedure Date  . Coronary angioplasty with stent placement     Social History: History   Social History  . Marital Status: Widowed    Spouse Name: [employer]    Number of Children: 0  . Years of Education: Associate   Occupational History  . Retired- Building surveyor   Social History Main Topics  . Smoking status: Never Smoker   . Smokeless tobacco: Never Used  . Alcohol Use: No  . Drug Use: No  . Sexually Active: None   Other Topics Concern  . None     Social History Narrative   Health Care POA: Emergency Contact: 911End of Life Plan: Who lives with you: selfAny pets: noneDiet: pt does not cook for himself, eats vegetables plates at [employer] daily.Exercise: No regular exercise routine, but takes care of lawns.Seatbelts: Pt reports wearing seatbelt when in vehicles. Sun Exposure/Protection: wears long sleeve and ball capsHobbies: yard work, relaxing on lazy-boy    Family History: Family History  Problem Relation Age of Onset  . Stroke Father     Allergies: No Known Allergies  Current Outpatient Prescriptions  Medication Sig Dispense Refill  . amLODipine (NORVASC) 10 MG tablet Take 10 mg by mouth daily.        Marland Kitchen aspirin 81 MG tablet Take 81 mg by mouth daily.       . carvedilol (COREG) 6.25 MG tablet Take 6.25 mg by mouth 2 (two) times daily with a meal.       . clopidogrel (PLAVIX) 75 MG tablet Take 75 mg by mouth daily.        . isosorbide mononitrate (IMDUR) 60 MG 24 hr tablet take 1 tablet by mouth once daily  30 tablet  0   Review Of Systems: as per hpi  Physical Exam: Pulse: 101  Blood Pressure: 131/81 RR: 16   O2: 98 on RA Temp: 97.9  General: alert, cooperative and appears stated age HEENT: PERRLA, extra ocular movement intact, sclera clear, anicteric, oropharynx clear, no lesions and poor dentition  Heart: S1, S2 normal, no murmur, rub or gallop, regular rate and rhythm Lungs: clear to auscultation, no wheezes or rales and unlabored breathing Abdomen: abdomen is soft without significant tenderness, masses, organomegaly or guarding Extremities: left shoulder pain, no edema, no redness, pain with external rotation of shoulder and forward flexion of shoulder, no point tenderness on palpation of shoulder joint Skin:no rashes Neurology: normal without focal findings, mental status, speech normal, alert and oriented x3, PERLA, cranial nerves 2-12 intact, muscle tone and strength normal and symmetric, reflexes normal and  symmetric, sensation grossly normal and gait and station normal- negative rhomberg  Labs and Imaging: Lab Results  Component Value Date/Time   NA 142 02/27/2011  4:05 PM   K 4.6 02/27/2011  4:05 PM   CL 107 02/27/2011  4:05 PM   CO2 26 02/27/2011  4:05 PM   BUN 10 02/27/2011  4:05 PM   CREATININE 1.10 02/27/2011  4:05 PM   CREATININE 0.98 02/26/2011  9:00 AM   GLUCOSE 121* 02/27/2011  4:05 PM   Lab Results  Component Value Date   WBC 7.0 02/27/2011   HGB 14.0 02/27/2011   HCT 41.1 02/27/2011   MCV 97.4 02/27/2011   PLT 202 02/27/2011   INR 1.05  Lab Results  Component Value Date   CKTOTAL 91 02/27/2011   CKMB 3.3 02/27/2011   TROPONINI <0.30 02/27/2011    CT of head-- 1. No acute finding.  2. Atrophy, chronic microvascular ischemic change and remote right  PCA territory infarct.  Left shoulder--x ray pending  EKG- NSR with motion artifact.--no clear st changes.    Assessment and Plan: ROSCOE WITTS is a 77 y.o. year old male presenting with fall and weakness in legs. 1. fall/weakness- Orthostasis versus cardiac versus TIA versus neurogenic claudication. May have been orthostasis in the setting of prolonged standing. Yet no dizziness and no syncope with event. No clear orthostasis and vital signs in the ER. Will hold blood pressure medications and monitor closely. We'll monitor on telemetry. Although no chest pain-we'll rule out cardiac of yet. Cardiac enzymes overnight. EKG repeat in the morning. Also, although no history of lumbar stenosis symptoms consistent with neurogenic claudication. Will obtain MRI of lumbar spine in the a.m.  Neuro cause such as TIA may have caused the episode. Patient no longer has weakness in his legs. Strength back to normal. CT of head shows remote right PCA territory infarct. Will obtain MRI/MRA of brain tomorrow. We'll monitor neuro exam overnight.  2. Hypertension- In the setting of fall of clear etiology will hold blood pressure  medications at this time. We'll monitor blood pressure closely. Currently within normal limits.  3. History of cardiac disease/stent placement- We'll continue Plavix and aspirin. Rule out cardiac event as per #1. We'll restart  Lisinopril and beta blocker once orthostasis rule out. continued lipid-lowering medication.   4. Systolic heart failure- We'll restart lisinopril and beta blocker as needed for blood pressure. Currently holding secondary to possible orthostasis.  5. Left shoulder pain- Injured during fall. Need to rule out fracture. X-ray pending.  6. FEN/GI:  regular diet  7 . Prophylaxis: heparin subcutaneous   8 . Disposition:  pending clinical improvement

## 2011-02-27 NOTE — ED Notes (Signed)
Pt was [employer] and spoke with salesman and then looked confused trying to get out the door and then he fell on his left side.  He did not hit his head.  Pt sts he remembers everything and states he was dizzy, headache, and nauseated.  All symptoms have resolved.  Pt is alert and oriented and c/o left shoulder pain and hurts with movement.  Pt has left knee pain with abrasion.  Pulses present.  No stroke symptoms.  bp 92/60 for ems and cbg 147.

## 2011-02-27 NOTE — Consult Note (Signed)
Reason for Consult: "right sided weakness"  HPI: Michael Morales is an 77 y.o. male. brought in after having fallen after feeling briefly dizzy, having a headache and nausea. He says that his right leg gave out. He fell on his left side and bruised his shoulder and knee. He was brought in and his BP was normal in ED, but reported to be 92/60 in the field. He complained of right shoulder pain.   Past Medical History  Diagnosis Date  . Hyperlipidemia   . Hypertension   . Coronary artery disease    Past Surgical History  Procedure Date  . Coronary artery bypass graft   . Cardiac surgery    Family History  Problem Relation Age of Onset  . Stroke Father    Social History:  reports that he has never smoked. He has never used smokeless tobacco. He reports that he does not drink alcohol or use illicit drugs.  Allergies: No Known Allergies  Medications: I have reviewed the patient's current medications.  ROS: unremarkable  Blood pressure 122/70, pulse 62, temperature 97.6 F (36.4 C), temperature source Oral, resp. rate 18, SpO2 100.00%.  Neurological exam: AAO*3. No aphasia.  Was able to tell me months of the year forwards and backwards correctly, exhibiting good attention span. Recall was 3 of 3 after 5 minutes. Followed complex commands. Cranial nerves: EOMI, PERRL. Visual fields were full. Sensation to V1 through V3 areas of the face was intact and symmetric throughout. There was no facial asymmetry. Hearing to finger rub was equal and symmetrical bilaterally. Shoulder shrug was 5/5 and symmetric bilaterally. Head rotation was 5/5 bilaterally. There was no dysarthria or palatal deviation. Eliciting maneuvers: Straight leg raise was negative bilaterally. Spurling maneuvers were performed in the patient and were negative. The patient also had Tinel's and Phalen's maneuvers performed at the wrists, which were negative. Motor: strength was 5/5 and symmetric throughout pain limited left proximal  exam. Sensory: was intact throughout to light touch, pinprick, vibration and proprioception. Coordination: finger-to-nose and heel-to-shin were intact and symmetric bilaterally. Reflexes: were 2+ in upper extremities and 1+ at the knees and 1+ at the ankles. Plantar response was downgoing bilaterally. Gait: Romberg test was negative. Tandem gait, toe and heel walk was within normal limits. There was no ataxia noted.  Results for orders placed during the hospital encounter of 02/27/11 (from the past 48 hour(s))  CBC     Status: Abnormal   Collection Time   02/27/11  3:24 PM      Component Value Range Comment   WBC 7.1  4.0 - 10.5 (K/uL)    RBC 4.17 (*) 4.22 - 5.81 (MIL/uL)    Hemoglobin 13.9  13.0 - 17.0 (g/dL)    HCT 25.9  56.3 - 87.5 (%)    MCV 96.9  78.0 - 100.0 (fL)    MCH 33.3  26.0 - 34.0 (pg)    MCHC 34.4  30.0 - 36.0 (g/dL)    RDW 64.3  32.9 - 51.8 (%)    Platelets 192  150 - 400 (K/uL)   POCT I-STAT, CHEM 8     Status: Abnormal   Collection Time   02/27/11  3:45 PM      Component Value Range Comment   Sodium 144  135 - 145 (mEq/L)    Potassium 4.5  3.5 - 5.1 (mEq/L)    Chloride 107  96 - 112 (mEq/L)    BUN 11  6 - 23 (mg/dL)    Creatinine,  Ser 1.10  0.50 - 1.35 (mg/dL)    Glucose, Bld 147 (*) 70 - 99 (mg/dL)    Calcium, Ion 8.29  1.12 - 1.32 (mmol/L)    TCO2 26  0 - 100 (mmol/L)    Hemoglobin 14.6  13.0 - 17.0 (g/dL)    HCT 56.2  13.0 - 86.5 (%)    Ct Head Wo Contrast  02/27/2011  *RADIOLOGY REPORT*  Clinical Data: Near-syncope.  Code stroke.  CT HEAD WITHOUT CONTRAST  Technique:  Contiguous axial images were obtained from the base of the skull through the vertex without contrast.  Comparison: Head CT 04/30/2008.  Findings: The patient has a remote right PCA territory infarct. The brain is atrophic with some chronic microvascular ischemic change.  No evidence of acute infarction, hemorrhage, mass lesion, mass effect, midline shift or abnormal extra-axial fluid collection.  No  hydrocephalus or pneumocephalus.  The calvarium is intact.  IMPRESSION:  1.  No acute finding. 2.  Atrophy, chronic microvascular ischemic change and remote right PCA territory infarct. 3. Critical Value/emergent results were called by telephone at the time of interpretation on 02/27/2011  at 4:00 p.m.  to  Dr. Preston Fleeting, who verbally acknowledged these results.  Original Report Authenticated By: Bernadene Bell. Maricela Curet, M.D.   Assessment/Plan: 77 years old man with what appears to have had a presyncopal episode. Patient denies loss of consciousness at any point. Recommend: 1) Syncope work-up 2) MRI/MRA brain 3) X-ray of left shoulder 4) Evaluate left shoulder bruise 5) Will follow  Shervon Kerwin 02/27/2011, 4:30 PM

## 2011-02-27 NOTE — ED Notes (Signed)
Pt taken to CT via stretcher where he was able to scoot from the stretcher to CT table without difficulty.

## 2011-02-27 NOTE — Telephone Encounter (Signed)
Refill request

## 2011-02-27 NOTE — ED Notes (Signed)
Attempted to phone report to floor; was told that nurse was unable to at this time. Left # for receiving RN to call and get patient report.

## 2011-02-28 ENCOUNTER — Inpatient Hospital Stay (HOSPITAL_COMMUNITY): Payer: Medicare Other

## 2011-02-28 ENCOUNTER — Other Ambulatory Visit: Payer: Self-pay

## 2011-02-28 DIAGNOSIS — R5383 Other fatigue: Secondary | ICD-10-CM

## 2011-02-28 DIAGNOSIS — R5381 Other malaise: Secondary | ICD-10-CM

## 2011-02-28 LAB — CBC
HCT: 36.9 % — ABNORMAL LOW (ref 39.0–52.0)
Hemoglobin: 13 g/dL (ref 13.0–17.0)
MCH: 33.9 pg (ref 26.0–34.0)
MCHC: 35.2 g/dL (ref 30.0–36.0)
RDW: 13.2 % (ref 11.5–15.5)

## 2011-02-28 LAB — BASIC METABOLIC PANEL
BUN: 16 mg/dL (ref 6–23)
Calcium: 9.1 mg/dL (ref 8.4–10.5)
GFR calc Af Amer: 76 mL/min — ABNORMAL LOW (ref >90)
GFR calc non Af Amer: 65 mL/min — ABNORMAL LOW (ref >90)
Glucose, Bld: 105 mg/dL — ABNORMAL HIGH (ref 70–99)

## 2011-02-28 LAB — CARDIAC PANEL(CRET KIN+CKTOT+MB+TROPI): Total CK: 89 U/L (ref 7–232)

## 2011-02-28 MED ORDER — ASPIRIN 81 MG PO CHEW
CHEWABLE_TABLET | ORAL | Status: AC
  Administered 2011-02-28: 81 mg
  Filled 2011-02-28: qty 1

## 2011-02-28 MED ORDER — IBUPROFEN 800 MG PO TABS: 800.0000 mg | ORAL_TABLET | Freq: Three times a day (TID) | ORAL | Status: AC | PRN

## 2011-02-28 MED ORDER — ACETAMINOPHEN 325 MG PO TABS: 650.0000 mg | ORAL_TABLET | Freq: Four times a day (QID) | ORAL | Status: DC | PRN

## 2011-02-28 MED ORDER — IBUPROFEN 800 MG PO TABS
800.0000 mg | ORAL_TABLET | Freq: Three times a day (TID) | ORAL | Status: DC | PRN
  Administered 2011-02-28: 800 mg via ORAL
  Filled 2011-02-28: qty 1

## 2011-02-28 MED ORDER — ASPIRIN EC 81 MG PO TBEC
81.0000 mg | DELAYED_RELEASE_TABLET | Freq: Every day | ORAL | Status: DC
  Filled 2011-02-28: qty 1

## 2011-02-28 MED ORDER — ACETAMINOPHEN 325 MG PO TABS: 650.0000 mg | ORAL_TABLET | Freq: Four times a day (QID) | ORAL | Status: AC | PRN

## 2011-02-28 NOTE — Discharge Summary (Signed)
Physician Discharge Summary  Patient ID: Michael Morales MRN: [id number] DOB/AGE: March 30, 1928 77 y.o.  Admit date: 02/27/2011 Discharge date: 02/28/2011  Admission Diagnoses: leg weakness  Discharge Diagnoses:  Acute stroke ruled-out with negative CT and resolved symptoms. Symptoms seem consistent with neurogenic claudication from spinal stenosis. Other etiologies include orthostasis or TIA.   Discharged Condition: good  Hospital Course: Michael Morales is a 77 y.o. year old male presenting with fall and weakness in legs.  1. Fall/weakness--Weakness resolved immediately after fall per pt. ACS ruled-out with negative serial cardiac enzymes x 2 and unchanged serial ECG x 2 showing no worrisome findings. Orthostatic blood pressures were negative. CT shows remote right PCA territory infarct. Although no history of lumbar stenosis, symptoms consistent with neurogenic claudication. MRI of the lumbar spine and MRI of the brain are pending. Patient was feeling stable, feeling back to his baseline, denying any weakness, and asking to go home. We felt it would be appropriate to have the patient get the MRIs done as an outpatient. He will follow-up at our clinic in 1-2 weeks.  2. Cardiac: history of cardiac disease/stent placement/chronic systolic heart failure--Denies any chest pain. ACS ruled-out as described under #1. Vital signs stable.   3. Left shoulder pain--sustained small fracture of humeral head after he fell on his left shoulder when his legs gave way. Offered sling but he does not want one. Pain controlled with Tylenol and ibuprofen. Good strength and range of motion on physical exam.   Follow-up issues: 1. Make sure his MRI of the brain and spine were scheduled. FMTS will try to call Radiology front desk tomorrow to schedule since closed today for Thanksgiving holiday.  2. Follow-up on left humeral fracture. Refer to [facility] (Dr. Jennette Kettle saw him in hospital) if pain not  improved in 2-4 weeks.   Consults: None  Significant Diagnostic Studies:  Ct Head Wo Contrast  IMPRESSION: 1. No acute finding. 2. Atrophy, chronic microvascular ischemic change and remote right PCA territory infarct.  LEFT SHOULDER - 2+ VIEW  IMPRESSION:  Small cortical avulsion fracture from the superior aspect of the  humeral head, near the rotator cuff insertion.  Discharge Exam: Blood pressure 107/67, pulse 74, temperature 97.5 F (36.4 C), temperature source Oral, resp. rate 20, height 5\' 10"  (1.778 m), weight 209 lb 10.5 oz (95.1 kg), SpO2 93.00%.  Disposition:   Discharge Orders    Future Orders Please Complete By Expires   Increase activity slowly        Current Discharge Medication List    START taking these medications   Details  acetaminophen (TYLENOL) 325 MG tablet Take 2 tablets (650 mg total) by mouth every 6 (six) hours as needed. Qty: 30 tablet, Refills: 0    ibuprofen (ADVIL,MOTRIN) 800 MG tablet Take 1 tablet (800 mg total) by mouth every 8 (eight) hours as needed. Qty: 30 tablet, Refills: 0      CONTINUE these medications which have NOT CHANGED   Details  amLODipine (NORVASC) 10 MG tablet Take 10 mg by mouth daily.      aspirin 81 MG tablet Take 81 mg by mouth daily.     carvedilol (COREG) 6.25 MG tablet Take 6.25 mg by mouth 2 (two) times daily with a meal.     clopidogrel (PLAVIX) 75 MG tablet Take 75 mg by mouth daily.      isosorbide mononitrate (IMDUR) 60 MG 24 hr tablet take 1 tablet by mouth once daily Qty: 30 tablet, Refills:  0    OVER THE COUNTER MEDICATION Take 1 tablet by mouth daily. For gassiness. OTC gas reducer, round pill     simvastatin (ZOCOR) 80 MG tablet take 1 tablet by mouth once daily Qty: 30 tablet, Refills: 0      STOP taking these medications     lisinopril (PRINIVIL,ZESTRIL) 40 MG tablet      RA ASPIRIN EC ADULT LOW ST 81 MG EC tablet          Signed: OH PARK, ANGELA 02/28/2011, 4:17 PM

## 2011-02-28 NOTE — Progress Notes (Signed)
MMSE: 22/30

## 2011-02-28 NOTE — Progress Notes (Signed)
FMTS Attending note: I have seen and examined Michael Morales. He is asymptomatic now except for left shoulder pain. I informed him of small "crack" in his shoulder bone and told him it should heal by itself. I offered sling for comfort but he does not want. I have reviewed his chart and agree with assessment and plan as detailed in Dr Edmonia James' note. Appreciate neurology consult in ED. Agree with TIA work up and also LS spine MRI to eval for spinal stenosis. Michael Morales

## 2011-02-28 NOTE — ED Provider Notes (Signed)
I saw and evaluated the patient, reviewed the resident's note and I agree with the findings and plan. I reviewed the resident's interpretation of ECG and agree with the exception that there is left axis deviation present.  Dione Booze, MD 02/28/11 [phone]

## 2011-02-28 NOTE — Progress Notes (Signed)
Family Medicine Inpatient Progress Note   Subjective: No events overnight.  Continues to have left shoulder pain with movement of left arm.  Objective: Vital signs in last 24 hours: Temp:  [97.6 F (36.4 C)-98.1 F (36.7 C)] 97.9 F (36.6 C) (11/22 0526) Pulse Rate:  [62-101] 74  (11/22 0526) Resp:  [16-18] 18  (11/22 0526) BP: (117-152)/(70-101) 140/83 mmHg (11/22 0526) SpO2:  [94 %-100 %] 94 % (11/22 0526) Weight:  [209 lb 10.5 oz (95.1 kg)-216 lb 7.9 oz (98.2 kg)] 209 lb 10.5 oz (95.1 kg) (11/22 0526) Weight change:  Last BM Date: 02/27/11    General: alert, cooperative and appears stated age, resting in bed HEENT: PERRLA, extra ocular movement intact, sclera clear, anicteric, oropharynx clear, no lesions and poor dentition  Heart: S1, S2 normal, no murmur, rub or gallop, regular rate and rhythm  Lungs: clear to auscultation, no wheezes or rales and unlabored breathing  Abdomen: abdomen is soft without significant tenderness, masses, organomegaly or guarding  Extremities: Left shoulder exam: + left shoulder pain with movement of left arm (with internal and external rotation and flexion of arm), + left radial pulse, normal sensation in upper ext. no edema, no redness, no point tenderness on palpation of shoulder joint or clavicle.   No lower ext edema Skin:no rashes  Neurology: normal without focal findings, mental status, speech normal, alert and oriented x3, PERLA, cranial nerves 2-12 intact, muscle tone and strength normal and symmetric, reflexes normal and symmetric, sensation grossly normal    Lab Results:  Basename 02/28/11 0500 02/27/11 1605  WBC 10.3 7.0  HGB 13.0 14.0  HCT 36.9* 41.1  PLT 185 202   BMET  Basename 02/28/11 0500 02/27/11 1605  NA 139 142  K 3.8 4.6  CL 105 107  CO2 24 26  GLUCOSE 105* 121*  BUN 16 10  CREATININE 1.04 1.10  CALCIUM 9.1 9.6   Lab Results  Component Value Date   CKTOTAL 89 02/28/2011   CKMB 2.8 02/28/2011   TROPONINI <0.30  02/28/2011   CE negative x 2.  Studies/Results: Ct Head Wo Contrast  IMPRESSION:  1.  No acute finding. 2.  Atrophy, chronic microvascular ischemic change and remote right PCA territory infarct.   Medications: I have reviewed the patient's current medications.  Assessment/Plan: Michael Morales is a 77 y.o. year old male presenting with fall and weakness in legs.  1. Fall/weakness---  Orthostasis versus cardiac versus TIA versus neurogenic claudication. Weakness resolved immediately after fall per pt.  Will continue to hold blood pressure medications and monitor closely.Continue telemetry. CE negative x 2.  EKG repeat-- pending. Also, although no history of lumbar stenosis, symptoms consistent with neurogenic claudication. Will obtain MRI of lumbar spine in the a.m.  CT of head shows remote right PCA territory infarct. Will obtain MRI/MRA of brain tomorrow. We'll monitor neuro exam overnight.   2. Hypertension-  In the setting of fall of unclear etiology will hold blood pressure medications at this time. We'll monitor blood pressure closely. Currently within normal limits.   3. History of cardiac disease/stent placement-  We'll continue Plavix and aspirin.Continue Rule out cardiac event as per #1. 3rd set of cardiac enzymes pending.  Morning EKG pending. We'll restart Lisinopril and beta blocker once orthostasis rule out. continued lipid-lowering medication.   4. Systolic heart failure-  We'll restart lisinopril and beta blocker as needed for blood pressure. Currently holding secondary to possible orthostasis.   5. Left shoulder pain-  Injured during  fall. Need to rule out fracture. X-ray pending.   6. FEN/GI: regular diet   7 . Prophylaxis: heparin subcutaneous   8 . Disposition: pending clinical improvement      LOS: 1 day   Prestina Raigoza 02/28/2011, 7:25 AM

## 2011-02-28 NOTE — H&P (Signed)
FMTS ATTENDING ADMIT NOTE: I have seen and examined the patient, reviewed the chart; discussed with the resident at the time of admission. I agree with the assessment and plan as detailed in the note. 1. Leg weakness and numbness ----acute--- no prior symptoms but could be neurogenic claudication. Could also be TIA with weakness. Agree with plan. 2. Left shoulder pain---small cortical defect on humeral head---should resolve. May need eval outpatient for possible small rotator cuff tear ---he strength seems largely intact but exam somewhat difficult secondary to pain with supraspinatus testing. I would be happy to see him in follow up at the [facility] outpatient. [phone].  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 I did discuss he might want to not try any heavy overhead lifting for a couple of week.

## 2011-03-04 ENCOUNTER — Encounter: Payer: Self-pay | Admitting: Family Medicine

## 2011-03-04 ENCOUNTER — Other Ambulatory Visit: Payer: Self-pay | Admitting: Family Medicine

## 2011-03-04 DIAGNOSIS — R55 Syncope and collapse: Secondary | ICD-10-CM

## 2011-03-04 NOTE — Progress Notes (Signed)
Called radiology [phone]) and scheduled appointment for MRI brain and lumbar spine. We were not able to perform in the hospital before his discharge.   11/29 (Thursday) at 8:00 am.  Will report to Radiology Department at 7:45 am.  Patient is at clinic now, will let him know.

## 2011-03-05 ENCOUNTER — Encounter: Payer: Self-pay | Admitting: Home Health Services

## 2011-03-05 NOTE — Progress Notes (Signed)
I have reviewed this visit and discussed with Suzanne Lineberry and agree with her documentation  

## 2011-03-06 ENCOUNTER — Other Ambulatory Visit: Payer: Self-pay | Admitting: Family Medicine

## 2011-03-06 ENCOUNTER — Encounter: Payer: Self-pay | Admitting: Family Medicine

## 2011-03-06 DIAGNOSIS — R55 Syncope and collapse: Secondary | ICD-10-CM

## 2011-03-07 ENCOUNTER — Ambulatory Visit (HOSPITAL_COMMUNITY)
Admission: RE | Admit: 2011-03-07 | Discharge: 2011-03-07 | Disposition: A | Discharge status: Home / Self Care | Payer: Medicare Other | Source: Ambulatory Visit | Attending: Family Medicine | Admitting: Family Medicine

## 2011-03-07 ENCOUNTER — Other Ambulatory Visit: Payer: Self-pay | Admitting: Family Medicine

## 2011-03-07 DIAGNOSIS — R55 Syncope and collapse: Secondary | ICD-10-CM

## 2011-03-07 DIAGNOSIS — M51379 Other intervertebral disc degeneration, lumbosacral region without mention of lumbar back pain or lower extremity pain: Secondary | ICD-10-CM | POA: Insufficient documentation

## 2011-03-07 DIAGNOSIS — M5137 Other intervertebral disc degeneration, lumbosacral region: Secondary | ICD-10-CM | POA: Insufficient documentation

## 2011-03-07 DIAGNOSIS — M4120 Other idiopathic scoliosis, site unspecified: Secondary | ICD-10-CM | POA: Insufficient documentation

## 2011-03-14 ENCOUNTER — Encounter: Payer: Self-pay | Admitting: Family Medicine

## 2011-03-14 ENCOUNTER — Ambulatory Visit (INDEPENDENT_AMBULATORY_CARE_PROVIDER_SITE_OTHER): Payer: Medicare Other | Admitting: Family Medicine

## 2011-03-14 VITALS — BP 140/65 | HR 65 | Temp 98.3°F | Ht 70.0 in | Wt 216.0 lb

## 2011-03-14 DIAGNOSIS — S42309A Unspecified fracture of shaft of humerus, unspecified arm, initial encounter for closed fracture: Secondary | ICD-10-CM

## 2011-03-14 DIAGNOSIS — I10 Essential (primary) hypertension: Secondary | ICD-10-CM

## 2011-03-14 DIAGNOSIS — S42302A Unspecified fracture of shaft of humerus, left arm, initial encounter for closed fracture: Secondary | ICD-10-CM

## 2011-03-14 DIAGNOSIS — M48061 Spinal stenosis, lumbar region without neurogenic claudication: Secondary | ICD-10-CM

## 2011-03-14 DIAGNOSIS — Z23 Encounter for immunization: Secondary | ICD-10-CM

## 2011-03-14 NOTE — Patient Instructions (Signed)
Please let me know if you have any more falls.  No changes to your medications today.  But you may take 2 of the acetominophen at nighttime if your left shoulder bothers you.  Follow-up with Dr. Hulen Luster or with me in 3 months.

## 2011-03-14 NOTE — Progress Notes (Signed)
  Subjective:    Patient ID: Michael Morales, male    DOB: 07/11/1927, 77 y.o.   MRN: [id number]  HPI Hospital follow-up for fall secondary to spinal stenosis.  Discussed MRI results (MRI done as outpatient) Patient denies any more episodes of falling.  Denies back pain  2. Left humeral fracture Occasional pain at nighttime Not taking any pain medication ROS: denies weakness, numbness  3. CAD Compliant with medications ROS: denies chest pain, SOB  Review of Systems Per HPI    Objective:   Physical Exam Gen: NAD Psych: pleasant, alert, engaged, appropriate; basic level of comprehension  CV: RRR Pulm: CTAB, no w/r/r Abd: NT, ND Ext: no edema Shoulder: left equivalent ROM compared to right; minimal point tenderness over bicipital groove area; no swelling bilaterally    Assessment & Plan:

## 2011-03-15 DIAGNOSIS — S42302A Unspecified fracture of shaft of humerus, left arm, initial encounter for closed fracture: Secondary | ICD-10-CM | POA: Insufficient documentation

## 2011-03-15 NOTE — Assessment & Plan Note (Addendum)
Pain seems well controlled without medications except at nighttime sometimes. Advised he take Tylenol as needed at nighttime for his pain. No frozen shoulder, no weakness, and minimal pain.

## 2011-03-15 NOTE — Assessment & Plan Note (Signed)
No more episodes of falling since fall that resulted in his hospitalization for 1 night. Diagnosed with spinal stenosis at that time. Stroke ruled-out.  Patient has been doing well since leaving hospital. He lives alone, independent, and very active for someone of his age. Discussed the results of MRI today. He will notify us if he has persistent worrisome symptoms: persistent falls, numbness in his lower legs, bladder or bowel incontinence. Otherwise, follow-up in 3 months.

## 2011-03-15 NOTE — Assessment & Plan Note (Signed)
Controlled on current medications. Continue.

## 2011-04-06 ENCOUNTER — Other Ambulatory Visit: Payer: Self-pay | Admitting: Family Medicine

## 2011-04-08 NOTE — Telephone Encounter (Signed)
Refill request

## 2011-04-10 NOTE — Telephone Encounter (Signed)
Refill request

## 2011-04-13 ENCOUNTER — Other Ambulatory Visit: Payer: Self-pay | Admitting: Family Medicine

## 2011-04-13 NOTE — Telephone Encounter (Signed)
Refill request

## 2011-04-17 ENCOUNTER — Telehealth: Payer: Self-pay | Admitting: Family Medicine

## 2011-04-17 NOTE — Telephone Encounter (Signed)
Attending Physician Statement  Form placed in Dr. Jeri Lager box for completion.  Michael Morales

## 2011-04-17 NOTE — Telephone Encounter (Signed)
Patient dropped off form to be filled out for insurance company.  Please call him when completed.

## 2011-04-23 NOTE — Telephone Encounter (Signed)
Not in my box today.  I will look again.

## 2011-04-24 NOTE — Telephone Encounter (Signed)
Left message on voicemail that Attending Physician's Statement form is ready to be picked up at front desk.  Ileana Ladd

## 2011-05-07 ENCOUNTER — Other Ambulatory Visit: Payer: Self-pay | Admitting: Family Medicine

## 2011-05-07 NOTE — Telephone Encounter (Signed)
Refill request

## 2011-05-14 ENCOUNTER — Other Ambulatory Visit: Payer: Self-pay | Admitting: Family Medicine

## 2011-05-14 NOTE — Telephone Encounter (Signed)
Refill request

## 2011-06-05 ENCOUNTER — Other Ambulatory Visit: Payer: Self-pay | Admitting: Family Medicine

## 2011-06-05 NOTE — Telephone Encounter (Signed)
Refill request

## 2011-06-14 ENCOUNTER — Ambulatory Visit: Payer: Medicare Other | Admitting: Family Medicine

## 2011-06-17 ENCOUNTER — Ambulatory Visit: Payer: Medicare Other | Admitting: Family Medicine

## 2011-06-28 ENCOUNTER — Encounter: Payer: Self-pay | Admitting: Family Medicine

## 2011-06-28 ENCOUNTER — Ambulatory Visit (INDEPENDENT_AMBULATORY_CARE_PROVIDER_SITE_OTHER): Payer: Medicare Other | Admitting: Family Medicine

## 2011-06-28 VITALS — BP 132/61 | HR 69 | Temp 97.9°F | Ht 70.0 in | Wt 219.0 lb

## 2011-06-28 DIAGNOSIS — E785 Hyperlipidemia, unspecified: Secondary | ICD-10-CM

## 2011-06-28 DIAGNOSIS — I10 Essential (primary) hypertension: Secondary | ICD-10-CM

## 2011-06-28 DIAGNOSIS — R739 Hyperglycemia, unspecified: Secondary | ICD-10-CM | POA: Insufficient documentation

## 2011-06-28 DIAGNOSIS — R7309 Other abnormal glucose: Secondary | ICD-10-CM

## 2011-06-28 DIAGNOSIS — I5022 Chronic systolic (congestive) heart failure: Secondary | ICD-10-CM

## 2011-06-28 LAB — POCT GLYCOSYLATED HEMOGLOBIN (HGB A1C): Hemoglobin A1C: 5.9

## 2011-06-28 NOTE — Patient Instructions (Signed)
Please check your medicine bottles against my list Call if anything is different Let me know which you arent taking that are on the list.

## 2011-06-30 ENCOUNTER — Encounter: Payer: Self-pay | Admitting: Family Medicine

## 2011-06-30 NOTE — Progress Notes (Signed)
  Subjective:    Patient ID: ABDIEL BLACKERBY, male    DOB: 1928/03/16, 78 y.o.   MRN: [id number]  HPI  Here for CPE today.   Weight gain- pt has continued eating the same but does not get much exercise in the winter.  He plans to do yard work and maintenance work in the spring.  He thinks he will lose weight then. Denies any CP, SOB.  Can walk as far as he wants.  Can do what he wants without limitation.  Eating a bojangles veggie plate daily.    No complaints of hearing loss.  No complaints of low mood. No change in BM.  No abd pain.  No smoking, no drinking, no drug use Review of Systems    see above Objective:   Physical Exam  Vital signs reviewed General appearance - alert, well appearing, and in no distress Heart - normal rate, regular rhythm, normal S1, S2, no murmurs, rubs, clicks or gallops Chest - clear to auscultation, no wheezes, rales or rhonchi, symmetric air entry, no tachypnea, retractions or cyanosis Abdomen - soft, nontender, nondistended, no masses or organomegaly Extremities - peripheral pulses normal, no pedal edema, no clubbing or cyanosis       Assessment & Plan:

## 2011-07-02 ENCOUNTER — Other Ambulatory Visit: Payer: Self-pay | Admitting: Family Medicine

## 2011-07-03 ENCOUNTER — Encounter: Payer: Self-pay | Admitting: Family Medicine

## 2011-07-03 NOTE — Assessment & Plan Note (Signed)
Patient with history of high blood sugars on random checks. Check A1c today.

## 2011-07-03 NOTE — Assessment & Plan Note (Signed)
Well-controlled on current regimen. Continue current management.

## 2011-07-03 NOTE — Assessment & Plan Note (Signed)
Lab Results  Component Value Date   CHOL 109 02/26/2011   HDL 37* 02/26/2011   LDLCALC 59 02/26/2011   LDLDIRECT 149* 07/23/2006   TRIG 67 02/26/2011   CHOLHDL 2.9 02/26/2011   most recent LDL of 59. Will be due for recheck in November of this year.

## 2011-07-03 NOTE — Assessment & Plan Note (Signed)
No edema on exam. No crackles lungs. Patient is doing well.

## 2011-07-13 ENCOUNTER — Other Ambulatory Visit: Payer: Self-pay | Admitting: Family Medicine

## 2011-08-07 ENCOUNTER — Other Ambulatory Visit: Payer: Self-pay | Admitting: Family Medicine

## 2011-09-10 ENCOUNTER — Other Ambulatory Visit: Payer: Self-pay | Admitting: Family Medicine

## 2011-09-12 ENCOUNTER — Other Ambulatory Visit: Payer: Self-pay | Admitting: Family Medicine

## 2011-10-11 ENCOUNTER — Other Ambulatory Visit: Payer: Self-pay | Admitting: Family Medicine

## 2011-11-14 ENCOUNTER — Other Ambulatory Visit: Payer: Self-pay | Admitting: Family Medicine

## 2011-11-18 NOTE — Telephone Encounter (Signed)
Needs appointment

## 2011-12-12 ENCOUNTER — Other Ambulatory Visit: Payer: Self-pay | Admitting: Family Medicine

## 2011-12-17 ENCOUNTER — Other Ambulatory Visit: Payer: Self-pay | Admitting: Family Medicine

## 2012-01-14 ENCOUNTER — Other Ambulatory Visit: Payer: Self-pay | Admitting: Family Medicine

## 2012-01-14 NOTE — Telephone Encounter (Signed)
Needs appointment for more refills.

## 2012-02-11 ENCOUNTER — Ambulatory Visit (INDEPENDENT_AMBULATORY_CARE_PROVIDER_SITE_OTHER): Payer: Medicare Other | Admitting: Family Medicine

## 2012-02-11 ENCOUNTER — Encounter: Payer: Self-pay | Admitting: Family Medicine

## 2012-02-11 VITALS — BP 144/69 | HR 65 | Temp 97.8°F | Ht 70.0 in | Wt 220.0 lb

## 2012-02-11 DIAGNOSIS — M48061 Spinal stenosis, lumbar region without neurogenic claudication: Secondary | ICD-10-CM

## 2012-02-11 DIAGNOSIS — I2510 Atherosclerotic heart disease of native coronary artery without angina pectoris: Secondary | ICD-10-CM

## 2012-02-11 DIAGNOSIS — I252 Old myocardial infarction: Secondary | ICD-10-CM

## 2012-02-11 DIAGNOSIS — Z0000 Encounter for general adult medical examination without abnormal findings: Secondary | ICD-10-CM

## 2012-02-11 DIAGNOSIS — Z23 Encounter for immunization: Secondary | ICD-10-CM

## 2012-02-11 DIAGNOSIS — I10 Essential (primary) hypertension: Secondary | ICD-10-CM

## 2012-02-11 DIAGNOSIS — E785 Hyperlipidemia, unspecified: Secondary | ICD-10-CM

## 2012-02-11 DIAGNOSIS — I5022 Chronic systolic (congestive) heart failure: Secondary | ICD-10-CM

## 2012-02-11 MED ORDER — ASPIRIN 81 MG PO TABS: 81.0000 mg | ORAL_TABLET | Freq: Every day | ORAL | Status: DC

## 2012-02-11 MED ORDER — AMLODIPINE BESYLATE 10 MG PO TABS: 10.0000 mg | ORAL_TABLET | Freq: Every day | ORAL | Status: DC

## 2012-02-11 MED ORDER — CARVEDILOL 6.25 MG PO TABS: 6.2500 mg | ORAL_TABLET | Freq: Two times a day (BID) | ORAL | Status: DC

## 2012-02-11 MED ORDER — CLOPIDOGREL BISULFATE 75 MG PO TABS: 75.0000 mg | ORAL_TABLET | Freq: Every day | ORAL | Status: DC

## 2012-02-11 MED ORDER — SIMVASTATIN 80 MG PO TABS: 80.0000 mg | ORAL_TABLET | Freq: Every day | ORAL | Status: DC

## 2012-02-11 MED ORDER — LISINOPRIL 40 MG PO TABS: 40.0000 mg | ORAL_TABLET | Freq: Every day | ORAL | Status: DC

## 2012-02-11 MED ORDER — ISOSORBIDE MONONITRATE ER 60 MG PO TB24: 60.0000 mg | ORAL_TABLET | Freq: Every day | ORAL | Status: DC

## 2012-02-11 NOTE — Assessment & Plan Note (Signed)
Stable. Asymptomatic.

## 2012-02-11 NOTE — Progress Notes (Signed)
  Subjective:    Patient ID: Michael Morales, male    DOB: 09/30/1927, 78 y.o.   MRN: [id number]  HPI He is here for routine follow-up and medication refills  # HLD Compliant with statin ROS: denies abdominal pain or muscle weakness/aches  # HTN Compliant with medications  # CAD s/p stenting ROS: denies chest pain/palpitations, syncope, dyspnea, edema  # Lumbar spinal stenosis with history of syncope and fall attributed to this a year ago ROS: denies syncope/falls or leg weakness/pain/numbness or urinary/bladder incontinence  # Preventative  Review of Systems Per HPI  Allergies, medication, past medical history reviewed.  HLD--02/2011 LDL 59, HDL 37, total 109, TG 67; on simvastatin 80  HTN ?History of MI (on problem list but patient is not aware of this), CAD s/p stemtomg--on Coreg 6.25 bid, Plavix, BASA, isosorbide 60, lisinopril, simvastatin 80 Chronic systolic HF--04/2008 ECHO EF 35%; mild diastolic dysfunction; mild mitral valve calcification and trivial regurgitation; mild Ao valve thickening Lumbar spinal stenosis with neurogenic claudication--presented with syncope and subsequent left humeral fracture and was hospitalized 03/2011 and diagnosed Hyperglycemia--06/2011 A1c 5.9    Objective:   Physical Exam GEN: NAD; well-appearing, -nourished PSYCH: pleasant, alert and oriented CV: very distant heart sounds, RRR, no audible murmurs; 2+ radial and pedal pulses bilaterally PULM: NI WOB; CTAB EXT: no edema NEURO: 5/5 strength upper and lower extremities; 2+ popliteal and Achilles tendon reflexes; gait normal except walking with thoracic kyphosis  MMSE: 27/30 (for 3-item recall)    Assessment & Plan:

## 2012-02-11 NOTE — Assessment & Plan Note (Signed)
Controlled on current medications. Continue.  

## 2012-02-11 NOTE — Assessment & Plan Note (Signed)
Stable. Continue current medications. Although he has not seen a cardiologist for the past several years, he does not have angina symptoms. He was advised to follow-up if any concerning symptoms. We will continue aspirin and Plavix for now since he is tolerating them well.

## 2012-02-11 NOTE — Assessment & Plan Note (Signed)
We will re-check lipid panel at follow-up visit in 6 months. 02/2011 LDL 59, HDL 37, total 109, TG 67; on simvastatin 80  Continue simvastatin for now.

## 2012-02-11 NOTE — Patient Instructions (Addendum)
Follow-up in 6 months and make an early morning appointment Do not eat after midnight before your appointment  God bless you.

## 2012-02-11 NOTE — Assessment & Plan Note (Signed)
His mentation is still very much intact. We discussed code status today. He is DNR/DNI.  Flu shot today.

## 2012-02-17 ENCOUNTER — Encounter: Payer: Self-pay | Admitting: Home Health Services

## 2012-02-21 ENCOUNTER — Ambulatory Visit (INDEPENDENT_AMBULATORY_CARE_PROVIDER_SITE_OTHER): Payer: Medicare Other | Admitting: Home Health Services

## 2012-02-21 ENCOUNTER — Encounter: Payer: Self-pay | Admitting: Home Health Services

## 2012-02-21 VITALS — BP 140/75 | HR 62 | Ht 70.0 in | Wt 220.0 lb

## 2012-02-21 DIAGNOSIS — Z0000 Encounter for general adult medical examination without abnormal findings: Secondary | ICD-10-CM

## 2012-02-21 NOTE — Patient Instructions (Signed)
1. Schedule an eye exam 2. Try working in your yard for exercise 2-3 times a week. 3. Consider eating 3-4 fruits and vegetables a day. 4. Continue to take your medications daily.

## 2012-02-21 NOTE — Progress Notes (Signed)
Patient here for annual wellness visit, patient reports: Risk Factors/Conditions needing evaluation or treatment: Pt does not have any new risk factors that need evaluation. Home Safety: Pt lives by self in 1 story home.  Pt reports having smoke detectors and alarm system.  Other Information: Corrective lens: Pt wears corrective lens for reading.  Does not recall last eye exam. Dentures: Pt has full upper dentures.  Pt does not wear them. Memory: Pt denies memory problems.  Patient's Mini Mental Score (recorded in doc. flowsheet): 24  Balance/Gait: Pt displays some abnormalities when walking see below.  Balance Abnormal Patient value  Sitting balance    Sit to stand    Attempts to arise    Immediate standing balance    Standing balance    Nudge    Eyes closed- Romberg    Tandem stance    Back lean x Not Able to lead back to anterior surfaces of shoulders are posterior to the posterior surface of the sacrum  Neck Rotation    360 degree turn    Sitting down     Gait Abnormal Patient value  Initiation of gait    Step length-left    Step length-right    Step height-left    Step height-right    Step symmetry    Step continuity    Path deviation    Trunk movement x the trunk does not remain nearly vertical in both planes  Walking stance x Medial malleoli pass greater than 2 inches of on another      Annual Wellness Visit Requirements Recorded Today In  Medical, family, social history Past Medical, Family, Social History Section  Current providers Care team  Current medications Medications  Wt, BP, Ht, BMI Vital signs  Tobacco, alcohol, illicit drug use History  ADL Nurse Assessment  Depression Screening Nurse Assessment  Cognitive impairment Nurse Assessment  Mini Mental Status Document Flowsheet  Fall Risk Nurse Assessment  Home Safety Progress Note  End of Life Planning (welcome visit) Social Documentation  Medicare preventative services Progress Note  Risk  factors/conditions needing evaluation/treatment Progress Note  Personalized health advice Patient Instructions, goals, letter  Diet & Exercise Social Documentation  Emergency Contact Social Documentation  Seat Belts Social Documentation  Sun exposure/protection Social Documentation    Medicare Prevention Plan:   Recommended Medicare Prevention Screenings Men over 65 Test For Frequency Date of Last- BOLD if needed  Colorectal Cancer 1-10 yrs NI-due to age  Prostate Cancer Never or yearly Discuss with PCP  Aortic Aneurysm Once if 65-75 with hx of smoking NI-no hx of smoking  Cholesterol 5 yrs 11/12  Diabetes yearly 11/12  HIV yearly declined  Influenza Shot yearly 11/13  Pneumonia Shot once 12/00  Zostavax Shot once discussed

## 2012-02-22 ENCOUNTER — Encounter: Payer: Self-pay | Admitting: Home Health Services

## 2012-02-22 NOTE — Progress Notes (Signed)
I have reviewed this visit and discussed with Suzanne Lineberry and agree with her documentation  

## 2012-09-25 ENCOUNTER — Other Ambulatory Visit: Payer: Self-pay | Admitting: Family Medicine

## 2013-02-05 ENCOUNTER — Ambulatory Visit (INDEPENDENT_AMBULATORY_CARE_PROVIDER_SITE_OTHER): Payer: Medicare Other | Admitting: Sports Medicine

## 2013-02-05 ENCOUNTER — Encounter: Payer: Self-pay | Admitting: Sports Medicine

## 2013-02-05 VITALS — BP 155/83 | HR 58 | Temp 97.9°F | Ht 70.0 in | Wt 220.3 lb

## 2013-02-05 DIAGNOSIS — K5900 Constipation, unspecified: Secondary | ICD-10-CM | POA: Insufficient documentation

## 2013-02-05 DIAGNOSIS — K644 Residual hemorrhoidal skin tags: Secondary | ICD-10-CM

## 2013-02-05 MED ORDER — POLYETHYLENE GLYCOL 3350 17 GM/SCOOP PO POWD: 17.0000 g | Freq: Two times a day (BID) | ORAL | Status: DC

## 2013-02-05 MED ORDER — HYDROCORTISONE ACETATE 25 MG RE SUPP: 25.0000 mg | Freq: Two times a day (BID) | RECTAL | Status: DC

## 2013-02-05 NOTE — Assessment & Plan Note (Addendum)
Miralax Clean out and daily to prevent Colnoscopy in 2011, with small polyp but reassuring for noncancerous cause. Increase fiber. Followup with PCP to ensure regularity.

## 2013-02-05 NOTE — Patient Instructions (Signed)
   Miralax bowel clean out.  Take 6 CAPFULS in 32oz of GATORADE today or tomorrow (plan on staying close to the bathroom afterwards).  Then 2 CAPFULS IN 16oz of water daily  Anusol   If you need anything prior to your next visit please call the clinic. Please Bring all medications or accurate medication list with you to each appointment; an accurate medication list is essential in providing you the best care possible.

## 2013-02-05 NOTE — Assessment & Plan Note (Signed)
Anusol

## 2013-02-05 NOTE — Progress Notes (Signed)
[facility] DEVONSHIRE - 79 y.o. male MRN [id number]  Date of birth: 1927/08/28  CC, HPI, INTERVAL HISTORY & ROS  Michael Morales is here today for constipation and rectal pain    He reports constipation X 2 weeks.  1BM in 3 weeks.  No fevers no chills.  Colonoscopy in 2011, with recommendation for repeat in 5 years.  No weight loss.  Mild nausea, no vomiting, mild anorexia  No urinary symptoms  He reports that while having a bowel movement last he noted a bulging that he self reduced that provided significant improvement in the burning and tingling sensation he is having on his rectum.  Patient has been lost to followup and has a history significant for hyperlipidemia, hypertension, prior MI, and known external hemorrhoids  He has tried taking Senokot without relief over the past 2 days.  History  Past Medical, Surgical, Social, and Family History Reviewed per EMR Medications and Allergies reviewed and all updated if necessary. Objective Findings  VITALS: HR: 58 bpm  BP: 155/83 mmHg  TEMP: 97.9 F (36.6 C) (Oral)  RESP:    HT: 5\' 10"  (177.8 cm)  WT: 220 lb 4.8 oz (99.927 kg)  BMI: 31.7   BP Readings from Last 3 Encounters:  02/05/13 155/83  02/21/12 140/75  02/11/12 144/69   Wt Readings from Last 3 Encounters:  02/05/13 220 lb 4.8 oz (99.927 kg)  02/21/12 220 lb (99.791 kg)  02/11/12 220 lb (99.791 kg)     PHYSICAL EXAM: GENERAL:  adult elderly Caucasian male. In no discomfort; no respiratory distress  PSYCH: alert and appropriate, good insight   HNEENT:   CARDIO:   LUNGS:   ABDOMEN: +BS, soft, non-tender, no rigidity, no guarding, no masses/hepatosplenomegaly  EXTREM:    GU:  rectal exam: External hemorrhoids noted on visual inspection.  There is no thrombosed hemorrhoids.  There is no perirectal erythema, rectal tone is normal prostate is not enlarged and there are no nodules.  No internal hemorrhoids appreciated   SKIN:     Assessment  & Plan   Problems addressed today: General Plan & Pt Instructions:  1. Unspecified constipation   2. External hemorrhoid       Miralax bowel clean out.  Take 6 CAPFULS in 32oz of GATORADE today or tomorrow (plan on staying close to the bathroom afterwards).  Then 2 CAPFULS IN 16oz of water daily  Anusol     For further discussion of A/P and for follow up issues see problem based charting if applicable.

## 2013-02-19 ENCOUNTER — Encounter: Payer: Self-pay | Admitting: Family Medicine

## 2013-02-19 ENCOUNTER — Ambulatory Visit (INDEPENDENT_AMBULATORY_CARE_PROVIDER_SITE_OTHER): Payer: Medicare Other | Admitting: Family Medicine

## 2013-02-19 VITALS — BP 175/85 | HR 57 | Temp 98.6°F | Ht 73.0 in | Wt 214.4 lb

## 2013-02-19 DIAGNOSIS — R1310 Dysphagia, unspecified: Secondary | ICD-10-CM

## 2013-02-19 MED ORDER — FAMOTIDINE 20 MG PO TABS: 20.0000 mg | ORAL_TABLET | Freq: Two times a day (BID) | ORAL | Status: DC

## 2013-02-19 NOTE — Patient Instructions (Signed)
I am sorry you are not feeling well. I want you to go see the stomach doctor on Monday to get it checked out better.  Until then, drink only liquids. No solid foods. You can drink broth soups, Ensure, milkshakes, etc.  If you start vomiting all the time, see any blood or start feeling badly, please go to the hospital to be seen over the weekend.  Luane Rochon M. Lionel Woodberry, M.D.

## 2013-02-19 NOTE — Progress Notes (Signed)
Patient ID: Michael Morales, male   DOB: 06-May-1927, 79 y.o.   MRN: [id number]  [facility] Michael Renteria M. [name], MD Phone: [phone]   Subjective: HPI: Patient is a 79 y.o. male presenting to clinic today for same day appointment due to N/V/abd pain. Patient states he cannot eat.  Nausea / Vomiting Patient complains of nausea and vomiting. Onset of symptoms was 2 days ago. Patient denies persistent nausea. Vomiting has occurred 3 times over the past 2 days. Vomitus is described as normal gastric contents. Symptoms have been associated with ability to keep down some fluids. Patient denies fever. Symptoms have waxed and waned. Evaluation to date has been none. Treatment to date has been none. Denies abdominal pain.  Was at [facility] yesterday, took 2 bites then had to run to the bathroom to vomit. Does not feel ill otherwise. States this happened in the 1990's and he "took a pill." Does not have a history of diabetes. Patient is requesting to "have a pill to make it better."  History Reviewed: Non-smoker. Health Maintenance: States he had flu shot, but not in our system.   ROS: Please see HPI above.  Objective: Office vital signs reviewed. BP 175/85  Pulse 57  Temp(Src) 98.6 F (37 C) (Oral)  Ht 6\' 1"  (1.854 m)  Wt 214 lb 6.4 oz (97.251 kg)  BMI 28.29 kg/m2  Physical Examination:  General: Awake, alert. NAD. Pleasant and happy HEENT: Atraumatic, normocephalic. MMM, posterior pharynx clear Neck: No masses palpated. No LAD Pulm: CTAB, no wheezes. Cardio: RRR, no murmurs appreciated. No chest wall tenderness Abdomen:+BS, soft, Non-tender Extremities: No edema Neuro: Grossly intact  Assessment: 79 y.o. male with acute dysphagia   Plan: See Problem List and After Visit Summary

## 2013-02-19 NOTE — Assessment & Plan Note (Signed)
Acute onset dysphagia, most likely esophageal phase based on story. I spoke with Dr. Haywood Pao office (who did his colonoscopy in 2011) and they are not able to see him until Monday. Recommended con't with fluids only and see them for possible EGD. Patient given instructions and continued to ask for a "pill." I am not sure exactly what would make him better if this is esophageal, but I gave him Pepcid in case there is an acid component. Patient encouraged to seek medical care over the weekend if he has any blood vomit, feels ill, has a fever or develops any new symptom. He agrees with plan.

## 2013-02-22 ENCOUNTER — Ambulatory Visit: Payer: Medicare Other | Admitting: Family Medicine

## 2013-03-06 ENCOUNTER — Other Ambulatory Visit: Payer: Self-pay | Admitting: Family Medicine

## 2013-03-11 ENCOUNTER — Other Ambulatory Visit: Payer: Self-pay | Admitting: Family Medicine

## 2013-03-19 ENCOUNTER — Encounter: Payer: Self-pay | Admitting: Family Medicine

## 2013-03-19 ENCOUNTER — Ambulatory Visit (INDEPENDENT_AMBULATORY_CARE_PROVIDER_SITE_OTHER): Payer: Medicare Other | Admitting: Family Medicine

## 2013-03-19 VITALS — BP 135/66 | HR 73 | Temp 98.3°F | Ht 73.0 in | Wt 219.0 lb

## 2013-03-19 DIAGNOSIS — K5900 Constipation, unspecified: Secondary | ICD-10-CM

## 2013-03-19 DIAGNOSIS — E785 Hyperlipidemia, unspecified: Secondary | ICD-10-CM

## 2013-03-19 DIAGNOSIS — I10 Essential (primary) hypertension: Secondary | ICD-10-CM

## 2013-03-19 DIAGNOSIS — Z131 Encounter for screening for diabetes mellitus: Secondary | ICD-10-CM

## 2013-03-19 MED ORDER — SIMVASTATIN 80 MG PO TABS: ORAL_TABLET | ORAL | Status: DC

## 2013-03-19 NOTE — Patient Instructions (Signed)
It was nice to meet you today.  Please schedule a LAB VISIT to get your blood work done in the next few weeks. The cholesterol test needs to be done FASTING, so try to get this done in the morning before you've eaten anything.  Otherwise continue taking your medications as prescribed. If any refills are needed, ask the pharmacist and they will send our office a refill request.

## 2013-03-19 NOTE — Assessment & Plan Note (Addendum)
Future fasting lipid order. Continue simvastatin 80mg  as tolerated, but continue to monitor as it exceeds max recommended dose 20mg  while on amlodipine 10mg .

## 2013-03-19 NOTE — Progress Notes (Signed)
   Subjective:    Patient ID: Michael Morales, male    DOB: Apr 15, 1927, 79 y.o.   MRN: [id number]  HPI  CC: medication refill  # Medication refill - simvastatin 80mg  needs to be refilled.  # Constipation - much improved since starting PEG - takes 1 cap 2 times a week  Review of Systems No CP, no SOB. No heartburn, no diarrhea. No muscle aches, no abdominal pain.    Objective:   Physical Exam BP 135/66  Pulse 73  Temp(Src) 98.3 F (36.8 C) (Oral)  Ht 6\' 1"  (1.854 m)  Wt 219 lb (99.338 kg)  BMI 28.90 kg/m2  General: NAD CV: RRR, normal heart sounds Resp: CTAB, effort normal Ext: no edema     Assessment & Plan:  See Problem List documentation

## 2013-03-19 NOTE — Assessment & Plan Note (Addendum)
Well controlled. No changes to meds today.

## 2013-03-20 NOTE — Assessment & Plan Note (Signed)
Symptoms improved on miralax. Continue to use weekly and increase acutely as needed.

## 2013-03-25 ENCOUNTER — Other Ambulatory Visit: Payer: Medicare Other

## 2013-03-25 DIAGNOSIS — I10 Essential (primary) hypertension: Secondary | ICD-10-CM

## 2013-03-25 DIAGNOSIS — E785 Hyperlipidemia, unspecified: Secondary | ICD-10-CM

## 2013-03-25 LAB — LIPID PANEL
Cholesterol: 101 mg/dL (ref 0–200)
HDL: 33 mg/dL — ABNORMAL LOW (ref >39)
Total CHOL/HDL Ratio: 3.1 Ratio
Triglycerides: 82 mg/dL (ref <150)
VLDL: 16 mg/dL (ref 0–40)

## 2013-03-25 LAB — BASIC METABOLIC PANEL
BUN: 9 mg/dL (ref 6–23)
CO2: 26 mEq/L (ref 19–32)
Calcium: 9.4 mg/dL (ref 8.4–10.5)
Creat: 0.96 mg/dL (ref 0.50–1.35)
Glucose, Bld: 114 mg/dL — ABNORMAL HIGH (ref 70–99)
Potassium: 4.3 mEq/L (ref 3.5–5.3)
Sodium: 138 mEq/L (ref 135–145)

## 2013-03-25 NOTE — Progress Notes (Signed)
BMP AND FLP DONE TODAY Michael Morales 

## 2013-04-02 ENCOUNTER — Other Ambulatory Visit: Payer: Self-pay | Admitting: Family Medicine

## 2013-05-12 ENCOUNTER — Telehealth: Payer: Self-pay | Admitting: *Deleted

## 2013-05-12 ENCOUNTER — Other Ambulatory Visit: Payer: Self-pay | Admitting: Family Medicine

## 2013-05-12 MED ORDER — AMLODIPINE BESYLATE 10 MG PO TABS: ORAL_TABLET | ORAL | Status: DC

## 2013-05-12 MED ORDER — LISINOPRIL 40 MG PO TABS: 40.0000 mg | ORAL_TABLET | Freq: Every day | ORAL | Status: DC

## 2013-05-12 MED ORDER — ISOSORBIDE MONONITRATE ER 60 MG PO TB24: 60.0000 mg | ORAL_TABLET | Freq: Every day | ORAL | Status: DC

## 2013-05-12 NOTE — Telephone Encounter (Signed)
Michael Morales, they didn't leave the name of the medications.  She just said it was 3 Rx requests.  Derl Barrow, RN

## 2013-05-12 NOTE — Telephone Encounter (Signed)
I received a few faxes for the medications which were submitted electronically and also in the to-be-sent fax pile. Mitzi Hansen

## 2013-05-12 NOTE — Telephone Encounter (Signed)
Tamika,  Do you know which meds? Fleeger, Salome Spotted

## 2013-05-12 NOTE — Telephone Encounter (Signed)
[facility] pharmacy associate called stating that the patient has been waiting for a couple of days for 3 Rx refill requests.  The requests was not received by the pharmacy, can the Rx be called in to pharmacy.  Contact # [phone].  Derl Barrow, RN

## 2013-06-18 ENCOUNTER — Telehealth: Payer: Self-pay

## 2013-06-18 NOTE — Telephone Encounter (Signed)
LVM for pt to call back to inquire as to whether he has had flu shot and to see if he is interested in getting one. MS

## 2013-11-23 ENCOUNTER — Other Ambulatory Visit: Payer: Self-pay | Admitting: Family Medicine

## 2013-12-13 ENCOUNTER — Other Ambulatory Visit: Payer: Self-pay | Admitting: Family Medicine

## 2014-01-17 ENCOUNTER — Other Ambulatory Visit: Payer: Self-pay | Admitting: Family Medicine

## 2014-02-11 ENCOUNTER — Encounter: Payer: Self-pay | Admitting: Family Medicine

## 2014-02-11 ENCOUNTER — Ambulatory Visit (INDEPENDENT_AMBULATORY_CARE_PROVIDER_SITE_OTHER): Payer: Medicare Other | Admitting: Family Medicine

## 2014-02-11 VITALS — BP 128/79 | HR 80 | Temp 97.9°F | Wt 215.4 lb

## 2014-02-11 DIAGNOSIS — I10 Essential (primary) hypertension: Secondary | ICD-10-CM | POA: Diagnosis not present

## 2014-02-11 DIAGNOSIS — R739 Hyperglycemia, unspecified: Secondary | ICD-10-CM

## 2014-02-11 DIAGNOSIS — I2510 Atherosclerotic heart disease of native coronary artery without angina pectoris: Secondary | ICD-10-CM

## 2014-02-11 DIAGNOSIS — E785 Hyperlipidemia, unspecified: Secondary | ICD-10-CM | POA: Diagnosis not present

## 2014-02-11 LAB — POCT GLYCOSYLATED HEMOGLOBIN (HGB A1C): HEMOGLOBIN A1C: 5.9

## 2014-02-11 MED ORDER — AMLODIPINE BESYLATE 10 MG PO TABS: ORAL_TABLET | ORAL | Status: DC

## 2014-02-11 MED ORDER — ASPIRIN 81 MG PO TABS
81.0000 mg | ORAL_TABLET | Freq: Every day | ORAL | Status: AC
Start: 2014-02-11 — End: ?

## 2014-02-11 MED ORDER — ISOSORBIDE MONONITRATE ER 60 MG PO TB24: ORAL_TABLET | ORAL | Status: DC

## 2014-02-11 MED ORDER — LISINOPRIL 40 MG PO TABS: ORAL_TABLET | ORAL | Status: DC

## 2014-02-11 MED ORDER — CARVEDILOL 6.25 MG PO TABS: ORAL_TABLET | ORAL | Status: DC

## 2014-02-11 MED ORDER — CLOPIDOGREL BISULFATE 75 MG PO TABS: ORAL_TABLET | ORAL | Status: DC

## 2014-02-11 NOTE — Assessment & Plan Note (Signed)
A1c today is again 5.9, "prediabetic". I would not recommend starting him on metformin at this time. Plan for yearly A1c.

## 2014-02-11 NOTE — Assessment & Plan Note (Addendum)
Stable. His past lipid panels have all been well controlled while he has been on simvastatin. Reports no issues with side effects from statin. Will plan on lipid panel every 2 years as I do not feel he needs this repeated yearly. Continue simvastatin.

## 2014-02-11 NOTE — Assessment & Plan Note (Signed)
Stable. Continue current medications including aspirin, plavix, imdur. No reports of anginal symptoms. Not followed by cardiologist, I'm not sure how much utility this would offer as he is asymptomatic and his contributing disease processes appear to be well controlled for many years.

## 2014-02-11 NOTE — Assessment & Plan Note (Addendum)
Well controlled. As pt is elderly, asked multiple times regarding hypotensive symptoms of which he reports none. Continue current medications/refills given. Repeat bmet today. F/u 6 months.

## 2014-02-11 NOTE — Progress Notes (Signed)
Patient ID: Michael Morales, male   DOB: [birth year], 80 y.o.   MRN: [id number]    Subjective:    Patient ID: Michael Morales, male    DOB: [birth year], 80 y.o.   MRN: [id number]  HPI  CC: follow up  # Hypertension:  Taking medications as prescribed  Requests refills  Denies any side effects from medications ROS: No CP, no SOB, no dizziness/lightheadedness, no syncope  # CAD:  No new concerns from patient  Not followed by cardiologist, hx of stents in 2001.  Takes aspirin, plavix ROS: No CP, no bleeding, no hematochezia or hematuria  # Hyperlipidemia  No new concerns from patient  Taking statin without issue, he feels "better" when he takes the zocor. ROS: no muscle aches or pains.  Review of Systems   See HPI for ROS. All other systems reviewed and are negative.  Past medical history, surgical, family, and social history reviewed and updated in the EMR as appropriate. Objective:  BP 128/79 mmHg  Pulse 80  Temp(Src) 97.9 F (36.6 C) (Oral)  Wt 97.705 kg (215 lb 6.4 oz) Vitals reviewed  General: NAD, 80yo caucasian male, slightly hard of hearing CV: RRR, normal s1/s2, no mrg. 2+ radial and PT pulses bilaterally Resp: CTAB, normal effort Abdomen: soft, nontender, nondistended, +BS Ext: no edema or cyanosis. WWP.   Assessment & Plan:  See Problem List Documentation

## 2014-02-11 NOTE — Patient Instructions (Signed)
It was good to see you again today.  We are testing your blood work for diabetes and to monitor your kidney function.  If the results are normal, we will send you a letter. If they are abnormal, we will call you and have you come back in 1-2 weeks.  Please follow up in 6 months if the results are normal.

## 2014-02-12 LAB — BASIC METABOLIC PANEL
BUN: 13 mg/dL (ref 6–23)
CO2: 26 mEq/L (ref 19–32)
CREATININE: 1.01 mg/dL (ref 0.50–1.35)
Calcium: 9.4 mg/dL (ref 8.4–10.5)
Chloride: 101 mEq/L (ref 96–112)
Glucose, Bld: 94 mg/dL (ref 70–99)
Potassium: 4.5 mEq/L (ref 3.5–5.3)
Sodium: 139 mEq/L (ref 135–145)

## 2014-02-14 ENCOUNTER — Encounter: Payer: Self-pay | Admitting: Family Medicine

## 2014-02-16 ENCOUNTER — Encounter: Payer: Self-pay | Admitting: Family Medicine

## 2014-02-22 ENCOUNTER — Other Ambulatory Visit: Payer: Self-pay | Admitting: Family Medicine

## 2014-07-20 ENCOUNTER — Other Ambulatory Visit: Payer: Self-pay | Admitting: Family Medicine

## 2014-09-08 ENCOUNTER — Other Ambulatory Visit: Payer: Self-pay | Admitting: Family Medicine

## 2014-09-08 NOTE — Telephone Encounter (Signed)
Refill request from pharmacy. Will forward to PCP for review. Khristi Schiller, CMA. 

## 2014-09-09 ENCOUNTER — Other Ambulatory Visit: Payer: Self-pay | Admitting: *Deleted

## 2014-09-09 DIAGNOSIS — I10 Essential (primary) hypertension: Secondary | ICD-10-CM

## 2014-09-12 MED ORDER — AMLODIPINE BESYLATE 10 MG PO TABS: ORAL_TABLET | ORAL | Status: DC

## 2014-10-19 ENCOUNTER — Other Ambulatory Visit: Payer: Self-pay | Admitting: Family Medicine

## 2014-10-19 NOTE — Telephone Encounter (Signed)
Refill request from pharmacy. Will forward to PCP for review. Jayveon Convey, CMA. 

## 2015-01-20 ENCOUNTER — Ambulatory Visit: Payer: Medicare Other | Admitting: Family Medicine

## 2015-01-25 ENCOUNTER — Ambulatory Visit (INDEPENDENT_AMBULATORY_CARE_PROVIDER_SITE_OTHER): Payer: Medicare Other | Admitting: Family Medicine

## 2015-01-25 ENCOUNTER — Encounter: Payer: Self-pay | Admitting: Family Medicine

## 2015-01-25 VITALS — BP 125/56 | HR 67 | Temp 97.9°F | Ht 73.0 in | Wt 207.7 lb

## 2015-01-25 DIAGNOSIS — Z9189 Other specified personal risk factors, not elsewhere classified: Secondary | ICD-10-CM

## 2015-01-25 NOTE — Progress Notes (Signed)
Date of Visit: 01/25/2015   HPI:  Patient presents in order to have documentation filled out so he can obtain a drivers license. He recently received notification from the [facility] that he failed to submit medical documentation, and his license was thus revoked. He does not know what medical documentation he needs to submit, all he has with him is the one piece of paper stating that his license has been revoked. He does not recall receiving any prior notification that medical documentation is necessary.  Of note patient came into clinic to request this appointment last week, and was given an appointment the following day. He got his days confused and therefore missed that appointment and was rescheduled for today.  He has a history of hyperlipidemia, hypertension, CAD, chronic systolic CHF, and lumbar spinal stenosis. He has followed up on average about once per year during the last couple of years.  He lives alone. His wife is deceased. He has no family or friends nearby. States he keeps to himself, goes to church every Sunday.  ROS: See HPI.  Golden: hyperlipidemia, hypertension, CAD, chronic systolic CHF, and lumbar spinal stenosis  PHYSICAL EXAM: BP 125/56 mmHg  Pulse 67  Temp(Src) 97.9 F (36.6 C) (Oral)  Ht 6\' 1"  (1.854 m)  Wt 207 lb 11.2 oz (94.212 kg)  BMI 27.41 kg/m2 Gen: NAD, pleasant, cooperative HEENT: NCAT Neuro: grossly nonfocal, speech normal. MOCA performed with score 12 out of 30.   ASSESSMENT/PLAN:  Driving safety issue Patient presents requesting documentation supporting him having a drivers license. He seemed confused about the visit and the surrounding circumstances, so I performed a MOCA [facility] Cognitive Assessment). His score was 12 out of 30, suggesting fairly significant cognitive impairment. I firmly advised that patient NOT drive anymore. I cannot support him obtaining a drivers license with this degree of cognitive impairment.  Patient voiced concerns about  how he will get food if he can't drive. He lives alone and has no friends or family nearby. Encouraged him to consider moving into a skilled nursing facility or assisted living facility. He was not interested in these options. Reiterated to patient that he should NOT drive, both legally as his license has been revoked, and medically due to his cognitive impairment. Will plan to make report to Adult Protective Services to see if there is anything meaningful they can do to assist him.   [place]. Ardelia Mems, [place]

## 2015-01-25 NOTE — Patient Instructions (Signed)
You should NOT drive. Your memory is not good enough to drive.

## 2015-01-26 ENCOUNTER — Telehealth: Payer: Self-pay | Admitting: Family Medicine

## 2015-01-26 DIAGNOSIS — Z9189 Other specified personal risk factors, not elsewhere classified: Secondary | ICD-10-CM | POA: Insufficient documentation

## 2015-01-26 NOTE — Telephone Encounter (Signed)
Patient requests PCP to complete DMV Form. Please, follow up.

## 2015-01-26 NOTE — Telephone Encounter (Signed)
Will forward to Dr. Ardelia Mems who saw patient on 01/25/2015 for an annual exam.  Forms placed in her box. Tineshia Becraft,CMA

## 2015-01-26 NOTE — Assessment & Plan Note (Signed)
Patient presents requesting documentation supporting him having a drivers license. He seemed confused about the visit and the surrounding circumstances, so I performed a MOCA [facility] Cognitive Assessment). His score was 12 out of 30, suggesting fairly significant cognitive impairment. I firmly advised that patient NOT drive anymore. I cannot support him obtaining a drivers license with this degree of cognitive impairment.  Patient voiced concerns about how he will get food if he can't drive. He lives alone and has no friends or family nearby. Encouraged him to consider moving into a skilled nursing facility or assisted living facility. He was not interested in these options. Reiterated to patient that he should NOT drive, both legally as his license has been revoked, and medically due to his cognitive impairment. Will plan to make report to Adult Protective Services to see if there is anything meaningful they can do to assist him.

## 2015-01-27 ENCOUNTER — Telehealth: Payer: Self-pay | Admitting: Family Medicine

## 2015-01-27 NOTE — Telephone Encounter (Signed)
I am NOT able to complete this DMV form for patient. I saw patient not for an annual exam, but to discuss his drivers license being revoked. His MOCA score was 12/30, suggestive of cognitive impairment. Advised patient that he should NOT drive. If he needs the larger packet of information filled out, will need to have dedicated visit with PCP to address all the issues the packet mentions. We did not discuss his chronic medical conditions.  Please inform patient. Leeanne Rio, MD

## 2015-01-27 NOTE — Telephone Encounter (Signed)
Patient dropped off DMV papers to be filled out. He needs these completed as quickly as possible and faxed to them.

## 2015-01-27 NOTE — Telephone Encounter (Signed)
Please see previous telephone note. Jazmin Hartsell,CMA  

## 2015-01-27 NOTE — Telephone Encounter (Signed)
Lm for patient to call back.  He will need an appt to see his pcp to have these forms filled out.  Please inform him of this when he calls back.  They are in blue teams folder and a copy in pcps box.  Thanks [employer]

## 2015-01-31 NOTE — Telephone Encounter (Signed)
Forms placed in provider's box. Keghan Mcfarren,CMA  

## 2015-02-06 NOTE — Telephone Encounter (Signed)
LV asking patient to schedule clinic appointment to discuss this paperwork. Should repeat the [facility] testing and if still low will need to re-iterate to him that he is not safe to drive. -Dr. Lamar Benes

## 2015-02-07 ENCOUNTER — Encounter: Payer: Self-pay | Admitting: Family Medicine

## 2015-02-07 NOTE — Progress Notes (Signed)
Patient ID: Michael Morales, male   DOB: Dec 13, 1927, 81 y.o.   MRN: [id number] I meet with Michael Morales at request of the [employer].  He wanted to talk to his doctor about "telling them I am crazy."  "Them" being the [place] DMV.  He relates a story of driving to [place] to appeal having his license revoked when he was pulled over by six policeman who drove in home, and drove his car home separately.  He said that he was driving without a license.  At this point is the story he showed me his [place] Driver's License that had an expiration date in 2018.   He is not certain if his driver's license has been revoked.  I informed him that from the last office note it appears that his license has been revoked. We discussed that his 19 do not think he is "crazy", but believe he has impaired memory.  Further explained that his physicians did not revoke his license, that this was done by the Medical Division of the [place] DMV.   He wanted to try to get his driver's license back,. At his request,  I helped him write a letter of appeal to the Medical Division of the [facility].  I did inform him that the letter of appeal must have been submitted within 10 days of the revocation of the license.  I told him that I believe he is passed the deadline but he still wanted to file an appeal.  The letter requesting an appeal is in his EMR.  It was posted from our office today.   I counseled him that if he is still driving that he is in violation of the law.  I recommended that he not drive unless he wins his appeal.

## 2015-03-07 ENCOUNTER — Other Ambulatory Visit: Payer: Self-pay | Admitting: Family Medicine

## 2015-03-07 NOTE — Telephone Encounter (Signed)
Refill request from pharmacy. Will forward to PCP for review. Maymuna Detzel, CMA. 

## 2015-03-20 ENCOUNTER — Other Ambulatory Visit: Payer: Self-pay | Admitting: Family Medicine

## 2015-04-04 ENCOUNTER — Other Ambulatory Visit: Payer: Self-pay | Admitting: Family Medicine

## 2015-04-27 ENCOUNTER — Telehealth: Payer: Self-pay | Admitting: *Deleted

## 2015-04-27 NOTE — Telephone Encounter (Signed)
Called patient to offer flu vaccine, however, VM picked up.  Left message on patient's voice mail to return call. Merit Gadsby L, RN   

## 2015-05-31 ENCOUNTER — Telehealth: Payer: Self-pay | Admitting: Family Medicine

## 2015-05-31 NOTE — Telephone Encounter (Signed)
Pt brought in DMV forms to be completed

## 2015-06-01 NOTE — Telephone Encounter (Signed)
Forms placed in provider's box. Jazmin Hartsell,CMA  

## 2015-06-01 NOTE — Telephone Encounter (Signed)
Called patient to offer flu vaccine, however, VM picked up.  Left message on patient's voice mail to return call. Atlantis Delong L, RN   

## 2015-06-05 ENCOUNTER — Encounter: Payer: Self-pay | Admitting: Family Medicine

## 2015-06-05 DIAGNOSIS — F0390 Unspecified dementia without behavioral disturbance: Secondary | ICD-10-CM | POA: Insufficient documentation

## 2015-06-07 ENCOUNTER — Ambulatory Visit (INDEPENDENT_AMBULATORY_CARE_PROVIDER_SITE_OTHER): Payer: Medicare Other | Admitting: Student

## 2015-06-07 ENCOUNTER — Encounter: Payer: Self-pay | Admitting: Student

## 2015-06-07 VITALS — BP 109/63 | HR 57 | Temp 98.2°F | Wt 207.0 lb

## 2015-06-07 DIAGNOSIS — Z9189 Other specified personal risk factors, not elsewhere classified: Secondary | ICD-10-CM

## 2015-06-07 NOTE — Progress Notes (Signed)
   Subjective:    Patient ID: NAZAR MUSA, male    DOB: [birth year], 82 y.o.   MRN: [id number]   CC: DMV paper work to renew drivers license  HPI: 82 y/o M with decreased cognitive capacity presents for desire for [employer] paper work to renew drivers license  DMW paper work - presents with documentation that his auto insurance will be cancelled in setting of revoked drivers license - asks for re-evaluation to re instate drivers license - continues to drive and drove himself to this visit - was stopped by the police yesterday [id number] but states he was told he could " keep on driving on his way" to get home and did not receive punishment for driving without an active license - denies any recent traffic accidents - does not have family close by to transport him, wife dies 07-22-06, no children - he does go to church with contacts there who could possibly help  Cognitive decline - denies confusion, or memory lapses - lives alone, manages his own money and pays bills - does his won shopping  He otherwise denies any concerns today, denies chest pain, SOB  Review of Systems ROS Per HPI Past Medical, Surgical, Social, and Family History Reviewed & Updated per EMR.   Objective:  BP 109/63 mmHg  Pulse 57  Temp(Src) 98.2 F (36.8 C) (Oral)  Wt 207 lb (93.895 kg) Vitals and nursing note reviewed  General: NAD, disheveled appearing with dirty clothes Cardiac: RRR, normal heart sounds,  Neuro: easily confused, often looses tract of subject of conversation with need for redirection.  For example when counseling him to follow up in one week, he initially seemed to understand but then asked if he had to stay in the clinic for one week until his appointment. It was necessary to explain multiple times that was not the case and he could go home   Assessment & Plan:    Driving safety issue DMV paper work NOT filled out this visit due to concern for cognitive decline. MOCA on 01/26/2015 was 12/30  underscoring his cognitive impairment. Given concern for his and other's safety, his driver's license was revoked, however he continues to drive - Explained that he should not drive and a cab was called to take home from clinic. He declined this and drove home. The police were informed to stop him if possible. They will call the clinic to follow up should they take any action - He does not have support to transport him (get groceries, etc), so adult protective services were called. They did not answer so a message was left, will continue to call - Options for SNF or assisted living were given but he adamantly denied these - will continue to follow closely      Zeric Baranowski A. Lincoln Brigham MD, [facility] Resident PGY-2 Pager [phone]

## 2015-06-07 NOTE — Assessment & Plan Note (Addendum)
DMV paper work NOT filled out this visit due to concern for cognitive decline. MOCA on 01/26/2015 was 12/30 underscoring his cognitive impairment. Given concern for his and other's safety, his driver's license was revoked, however he continues to drive - Explained that he should not drive and a cab was called to take home from clinic. He declined this and drove home. The police were informed to stop him if possible. They will call the clinic to follow up should they take any action - He does not have support to transport him (get groceries, etc), so adult protective services were called. They did not answer so a message was left, will continue to call - Options for SNF or assisted living were given but he adamantly denied these - will continue to follow closely

## 2015-06-07 NOTE — Patient Instructions (Signed)
Follow up in 1 week with PCP, Dr Lamar Benes to discuss driving status Please DO NOT Drive Please get a friend to help transport you Please consider SNF placement or assisted living facility is at all possible

## 2015-06-19 ENCOUNTER — Ambulatory Visit (INDEPENDENT_AMBULATORY_CARE_PROVIDER_SITE_OTHER): Payer: Medicare Other | Admitting: Family Medicine

## 2015-06-19 VITALS — BP 107/95 | HR 59 | Temp 97.6°F | Ht 73.0 in | Wt 202.9 lb

## 2015-06-19 DIAGNOSIS — F0390 Unspecified dementia without behavioral disturbance: Secondary | ICD-10-CM

## 2015-06-19 DIAGNOSIS — Z9189 Other specified personal risk factors, not elsewhere classified: Secondary | ICD-10-CM

## 2015-06-19 NOTE — Assessment & Plan Note (Addendum)
MOCA score again low 14/30 (of which 6 points was orientation which he remarkably got all correct), with interference with ADLs.  I do not feel he would benefit from aricept at this point. Discussed extensively his mental decline and that he should not be driving; that he puts himself and others at risk of injury or death every time he gets behind the wheel; also discussed possibility of being put in jail for driving without a valid license (he remained confused and thought he still had his license because it was in his pocket -- tried to tell him that having it in his physical possession does not automatically make the license valid). Went over [employer] paperwork, bus routes. Offered taxi which he adamantly declines. Also discussed assisted living which he became somewhat angry about discussing and refuses discussion. Spoke with him that I would be making referrals for the social workers to help get him transportation and any other services that he may need.

## 2015-06-19 NOTE — Progress Notes (Signed)
   Subjective:    Patient ID: Michael Morales, male    DOB: 08-21-1927, 82 y.o.   MRN: [id number]  HPI  CC: driving status  # Driving status:  License was revoked sometime around December  He continues to drive himself  Refuses taxi, bus, SCAT after heavy discussion  Says that he can drive okay, has not been in any accidents  Says that he has "no family" -- later mentions some family members around [place] near [employer] he "manages" everything -- but doesn't clean around house anymore  Denies changes in visions, headache  Social Hx: never smoker  Review of Systems   See HPI for ROS.   Past medical history, surgical, family, and social history reviewed and updated in the EMR as appropriate. Objective:  BP 107/95 mmHg  Pulse 59  Temp(Src) 97.6 F (36.4 C) (Oral)  Ht 6\' 1"  (1.854 m)  Wt 202 lb 14.4 oz (92.035 kg)  BMI 26.78 kg/m2 Vitals and nursing note reviewed  General: no apparent distress  Neuro: alert, oriented x 3. MOCA score 14/30.     Assessment & Plan:  Dementia MOCA score again low 14/30 (of which 6 points was orientation which he remarkably got all correct), with interference with ADLs.  I do not feel he would benefit from aricept at this point. Discussed extensively his mental decline and that he should not be driving; that he puts himself and others at risk of injury or death every time he gets behind the wheel; also discussed possibility of being put in jail for driving without a valid license (he remained confused and thought he still had his license because it was in his pocket -- tried to tell him that having it in his physical possession does not automatically make the license valid). Went over [employer] paperwork, bus routes. Offered taxi which he adamantly declines. Also discussed assisted living which he became somewhat angry about discussing and refuses discussion. Spoke with him that I would be making referrals for the social  workers to help get him transportation and any other services that he may need.

## 2015-06-19 NOTE — Telephone Encounter (Signed)
Discussed this issue with Michael Morales in office today. Neither I nor any of the doctors that have seen him here feel that it is safe for him to continue driving. I will not be able to fill out his DMV paperwork to help him get his license reinstated.

## 2015-06-23 ENCOUNTER — Encounter: Payer: Self-pay | Admitting: Licensed Clinical Social Worker

## 2015-06-23 NOTE — Progress Notes (Signed)
CSW informed that patient needs assistance with completing his application for [employer] SC[place] services.  This is the Initial visit with patient to assist with completing [employer] form, once in the office patient lstated if completing the form was for him to ride the bus he did not need the form completed.  Patient refused to complete the form or for CSW to assist with completing it.  States he is able to drive.  Patient was confused, thought the form would help him be able to continue driving. CSW discussed safety concerns of patient continuing to drive.  Advised him that he should not be driving. States he has community support from his church family to take him places if needed.   CSW reviewed several community support services, patient refused all services.    CSW discussed Adult Scientist, forensic and that a Education officer, museum would follow up to check on him.  Patient again states he does not need this. Patient previously offered Taxi at several other visits and continued to decline the offer.   Patient was pleasant, hard of hearing, presented with poor hygiene and dirty clothes. Call made to [place] [facility] after patient left the office.  CSW spoke to Frederic to provide information and concerns for possible self-neglect, concerns with mental decline and driving without a license.  CSW was informed that APS was involved with patient Oct 2016. APS will review information and determine if patient meets requirements to establish an open case.   Casimer Lanius. [place] [facility]  [phone] 3:47 PM

## 2015-08-17 ENCOUNTER — Telehealth: Payer: Self-pay | Admitting: Family Medicine

## 2015-08-17 NOTE — Telephone Encounter (Signed)
Pt brought in forms from [facility] to completed by Dr Lamar Benes regarding his ability to drive.  Pt states he is stopped at every stoplight by police. He is convinced the statement on his last pt summary " DO NOT DRIVE" is making the police stop him. He would like to pick up copies of the forms before they are mailed to [employer].

## 2015-08-17 NOTE — Telephone Encounter (Signed)
Forms placed in provider's box. Jazmin Hartsell,CMA  

## 2015-08-22 DIAGNOSIS — S20212A Contusion of left front wall of thorax, initial encounter: Secondary | ICD-10-CM | POA: Diagnosis not present

## 2015-08-22 DIAGNOSIS — S70212A Abrasion, left hip, initial encounter: Secondary | ICD-10-CM | POA: Diagnosis not present

## 2015-08-22 DIAGNOSIS — S60411A Abrasion of left index finger, initial encounter: Secondary | ICD-10-CM | POA: Diagnosis not present

## 2015-08-22 NOTE — Telephone Encounter (Signed)
Left voice message for patient that DMV form complete and ready for pick up.  Forms copied for scanning in patient's record.  Derl Barrow, RN

## 2015-08-22 NOTE — Telephone Encounter (Signed)
This has been discussed by myself and several other providers; we all feel he poses a risk as a driver and that he should not be driving himself. I have filled out the [facility] paperwork with the recommendation that he DOES NOT DRIVE due to his dementia, which has been documented in the past few visits. I will copy these to be faxed into his chart and the patient can pick up the originals.   Can you please call him and let him know that he can pick the forms up? -Dr. Lamar Benes

## 2015-10-16 ENCOUNTER — Other Ambulatory Visit: Payer: Self-pay | Admitting: *Deleted

## 2015-10-16 MED ORDER — LISINOPRIL 40 MG PO TABS
40.0000 mg | ORAL_TABLET | Freq: Every day | ORAL | Status: AC
Start: 2015-10-16 — End: ?

## 2015-10-16 NOTE — Telephone Encounter (Signed)
Called in refill for lisinopril 30 tabs with 3 refills to [facility] on [address]   Please call patient to make an appointment before further refills are given

## 2015-11-13 ENCOUNTER — Other Ambulatory Visit: Payer: Self-pay | Admitting: Family Medicine

## 2015-11-13 NOTE — Telephone Encounter (Signed)
2nd request.  Massie Cogliano L, RN  

## 2016-01-08 ENCOUNTER — Emergency Department: Payer: Medicare Other

## 2016-01-08 ENCOUNTER — Emergency Department
Admission: EM | Admit: 2016-01-08 | Discharge: 2016-01-08 | Disposition: A | Discharge status: Home / Self Care | Payer: Medicare Other | Attending: Emergency Medicine | Admitting: Emergency Medicine

## 2016-01-08 DIAGNOSIS — R4182 Altered mental status, unspecified: Secondary | ICD-10-CM | POA: Diagnosis present

## 2016-01-08 DIAGNOSIS — F0390 Unspecified dementia without behavioral disturbance: Secondary | ICD-10-CM | POA: Insufficient documentation

## 2016-01-08 DIAGNOSIS — D500 Iron deficiency anemia secondary to blood loss (chronic): Secondary | ICD-10-CM | POA: Diagnosis not present

## 2016-01-08 DIAGNOSIS — Z79899 Other long term (current) drug therapy: Secondary | ICD-10-CM | POA: Insufficient documentation

## 2016-01-08 DIAGNOSIS — Z7982 Long term (current) use of aspirin: Secondary | ICD-10-CM | POA: Diagnosis not present

## 2016-01-08 DIAGNOSIS — I10 Essential (primary) hypertension: Secondary | ICD-10-CM | POA: Insufficient documentation

## 2016-01-08 DIAGNOSIS — D649 Anemia, unspecified: Secondary | ICD-10-CM | POA: Diagnosis not present

## 2016-01-08 DIAGNOSIS — R410 Disorientation, unspecified: Secondary | ICD-10-CM | POA: Diagnosis not present

## 2016-01-08 DIAGNOSIS — I2510 Atherosclerotic heart disease of native coronary artery without angina pectoris: Secondary | ICD-10-CM | POA: Diagnosis not present

## 2016-01-08 LAB — CBC
HCT: 37.9 % — ABNORMAL LOW (ref 40.0–52.0)
HEMOGLOBIN: 13.1 g/dL (ref 13.0–18.0)
MCH: 35.6 pg — ABNORMAL HIGH (ref 26.0–34.0)
MCHC: 34.7 g/dL (ref 32.0–36.0)
MCV: 102.8 fL — ABNORMAL HIGH (ref 80.0–100.0)
Platelets: 257 10*3/uL (ref 150–440)
RBC: 3.68 MIL/uL — AB (ref 4.40–5.90)
RDW: 15 % — ABNORMAL HIGH (ref 11.5–14.5)
WBC: 8.9 10*3/uL (ref 3.8–10.6)

## 2016-01-08 LAB — ACETAMINOPHEN LEVEL

## 2016-01-08 LAB — URINE DRUG SCREEN, QUALITATIVE (ARMC ONLY)
AMPHETAMINES, UR SCREEN: NOT DETECTED
Barbiturates, Ur Screen: NOT DETECTED
Benzodiazepine, Ur Scrn: NOT DETECTED
Cannabinoid 50 Ng, Ur ~~LOC~~: NOT DETECTED
Cocaine Metabolite,Ur ~~LOC~~: NOT DETECTED
MDMA (ECSTASY) UR SCREEN: NOT DETECTED
METHADONE SCREEN, URINE: NOT DETECTED
Opiate, Ur Screen: NOT DETECTED
PHENCYCLIDINE (PCP) UR S: NOT DETECTED
Tricyclic, Ur Screen: NOT DETECTED

## 2016-01-08 LAB — URINALYSIS COMPLETE WITH MICROSCOPIC (ARMC ONLY)
Bacteria, UA: NONE SEEN
Bilirubin Urine: NEGATIVE
Glucose, UA: NEGATIVE mg/dL
Hgb urine dipstick: NEGATIVE
KETONES UR: NEGATIVE mg/dL
Leukocytes, UA: NEGATIVE
Nitrite: NEGATIVE
PROTEIN: NEGATIVE mg/dL
RBC / HPF: NONE SEEN RBC/hpf (ref 0–5)
SQUAMOUS EPITHELIAL / LPF: NONE SEEN
Specific Gravity, Urine: 1.008 (ref 1.005–1.030)
pH: 5 (ref 5.0–8.0)

## 2016-01-08 LAB — ETHANOL: Alcohol, Ethyl (B): <5 mg/dL (ref <5)

## 2016-01-08 LAB — COMPREHENSIVE METABOLIC PANEL
ALK PHOS: 64 U/L (ref 38–126)
ALT: 11 U/L — ABNORMAL LOW (ref 17–63)
ANION GAP: 5 (ref 5–15)
AST: 21 U/L (ref 15–41)
Albumin: 3.9 g/dL (ref 3.5–5.0)
BILIRUBIN TOTAL: 0.5 mg/dL (ref 0.3–1.2)
BUN: 7 mg/dL (ref 6–20)
CO2: 26 mmol/L (ref 22–32)
Calcium: 8.8 mg/dL — ABNORMAL LOW (ref 8.9–10.3)
Chloride: 105 mmol/L (ref 101–111)
Creatinine, Ser: 1.07 mg/dL (ref 0.61–1.24)
GFR calc non Af Amer: >60 mL/min (ref >60)
Glucose, Bld: 102 mg/dL — ABNORMAL HIGH (ref 65–99)
Potassium: 4.4 mmol/L (ref 3.5–5.1)
Sodium: 136 mmol/L (ref 135–145)
TOTAL PROTEIN: 7.3 g/dL (ref 6.5–8.1)

## 2016-01-08 LAB — TROPONIN I: Troponin I: <0.03 ng/mL (ref <0.03)

## 2016-01-08 LAB — SALICYLATE LEVEL

## 2016-01-08 NOTE — ED Triage Notes (Signed)
Pt brought into the Ed via [employer] officer [place], pt is from [place] and was attempting to drive to his nephews home in [place], [place] Michael Morales who is listed in pt contacts.. States pt has a revoked license for medical reasons.. Pt became confused and was attempting to see his nephew Michael Morales and got confused and ended up at a females [address], [place] who contacted police and drove the pt to the police station, states [facility] is filing a report with adult protective services.. Pt lives alone and has no close relatives to help care for him.Marland Kitchen

## 2016-01-08 NOTE — Discharge Instructions (Signed)
It is illegal for you to drive.  Return to the emergency department if you develop changes in her mental status, fever, severe pain, or any other symptoms concerning to you.

## 2016-01-08 NOTE — ED Provider Notes (Signed)
[facility] Emergency Department Provider Note  ____________________________________________  Time seen: Approximately 1:42 PM  I have reviewed the triage vital signs and the nursing notes.   HISTORY  Chief Complaint Altered Mental Status   HPI Michael Morales is a 82 y.o. male history of dementia, CAD, hypertension, hyperlipidemia who presents for evaluation of altered mental status. Patient had his driver's license revoked December 2016. Has been seen by his primary care doctor to receive medical clearance to obtain his license again. Was recently seen in March 2017 and at that time it was felt the patient was not safe to drive. Extensive discussion with patient was done at that time that he shouldn't be driving and should be using other methods of transportation. Today patient was brought in by police after getting lost on his way to [place] to visit his brother. Patient was driving from [facility] when he got lost. He ended up in a stranger's home in [place] when the police was called. Patient lives alone and has no close relatives. [facility] is working on getting APS involved. Patient does not know why he is here. He is telling me that he was trying to visit his brother. He does not remember driving or getting lost. He denies headache, chest pain, shortness breath, abdominal pain, nausea, vomiting. He has no medical complaints at this time.  Past Medical History:  Diagnosis Date  . Coronary artery disease   . GERD (gastroesophageal reflux disease)   . Hyperlipidemia   . Hypertension   . Myocardial infarction 02/27/11   pt denies this hx    Patient Active Problem List   Diagnosis Date Noted  . Dementia 06/05/2015  . Driving safety issue 01/26/2015  . Acute Dysphagia 02/19/2013  . Unspecified constipation 02/05/2013  . External hemorrhoid 02/05/2013  . Preventative health care 02/11/2012  . Hyperglycemia 06/28/2011  . Lumbar spinal stenosis 03/14/2011  .  Hyperlipidemia 07/23/2006  . Essential hypertension 07/23/2006  . Coronary atherosclerosis 07/23/2006  . MYOCARDIAL INFARCTION, OLD 06/05/2006  . Chronic systolic heart failure ([place]) 06/05/2006    Past Surgical History:  Procedure Laterality Date  . CORONARY ANGIOPLASTY WITH STENT PLACEMENT  2001   amLODipine (NORVASC) 10 MG tablet TAKE 1 TABLET BY MOUTH ONCE DAILY Leone Brand, MD Needs Review  aspirin 81 MG tablet Take 1 tablet (81 mg total) by mouth daily. Leone Brand, MD Needs Review  carvedilol (COREG) 6.25 MG tablet take 1 tablet by mouth twice a day with meals Leone Brand, MD Needs Review  clopidogrel (PLAVIX) 75 MG tablet take 1 tablet by mouth once daily Leone Brand, MD Needs Review  famotidine (PEPCID) 20 MG tablet Take 1 tablet (20 mg total) by mouth 2 (two) times daily. Amber Fidel Levy, MD Needs Review  hydrocortisone (ANUSOL-HC) 25 MG suppository Place 1 suppository (25 mg total) rectally 2 (two) times daily. Gerda Diss, DO Needs Review  isosorbide mononitrate (IMDUR) 60 MG 24 hr tablet take 1 tablet by mouth once daily Leone Brand, MD Needs Review  lisinopril (PRINIVIL,ZESTRIL) 40 MG tablet Take 1 tablet (40 mg total) by mouth daily. Steve Rattler, DO Needs Review  OVER THE COUNTER MEDICATION Take 1 tablet by mouth daily. For gassiness. OTC gas reducer, round pill Historical Provider, MD Needs Review  polyethylene glycol powder (GLYCOLAX/MIRALAX) powder Take 17 g by mouth 2 (two) times daily. And per instructions provided Gerda Diss, DO Needs Review  simvastatin (ZOCOR) 80 MG tablet TAKE  1 TABLET BY MOUTH AT BEDTIME Steve Rattler, DO Needs Review     Allergies Review of patient's allergies indicates no known allergies.  Family History  Problem Relation Age of Onset  . Stroke Father     Social History Social History  Substance Use Topics  . Smoking status: Never Smoker  . Smokeless tobacco: Never Used  . Alcohol use No    Review of  Systems Constitutional: Negative for fever. Eyes: Negative for visual changes. ENT: Negative for sore throat. Cardiovascular: Negative for chest pain. Respiratory: Negative for shortness of breath. Gastrointestinal: Negative for abdominal pain, vomiting or diarrhea. Genitourinary: Negative for dysuria. Musculoskeletal: Negative for back pain. Skin: Negative for rash. Neurological: Negative for headaches, weakness or numbness.  ____________________________________________   PHYSICAL EXAM:  VITAL SIGNS: ED Triage Vitals  Enc Vitals Group     BP 01/08/16 1308 126/80     Pulse Rate 01/08/16 1308 67     Resp 01/08/16 1308 17     Temp 01/08/16 1308 97.5 F (36.4 C)     Temp Source 01/08/16 1308 Oral     SpO2 01/08/16 1308 100 %     Weight 01/08/16 1309 200 lb (90.7 kg)     Height 01/08/16 1309 6\' 2"  (1.88 m)     Head Circumference --      Peak Flow --      Pain Score 01/08/16 1310 0     Pain Loc --      Pain Edu? --      Excl. in [place]? --     Constitutional: Alert and oriented x 3 (knows his name, knows it is 2017, and tells me he is in a place where you come to see doctors), no distress. Discheveled.  HEENT:      Head: Normocephalic and atraumatic.         Eyes: Conjunctivae are normal. Sclera is non-icteric. EOMI. PERRL      Mouth/Throat: Mucous membranes are dry.       Neck: Supple with no signs of meningismus. Cardiovascular: Regular rate and rhythm. No murmurs, gallops, or rubs. 2+ symmetrical distal pulses are present in all extremities. No JVD. Respiratory: Normal respiratory effort. Lungs are clear to auscultation bilaterally. No wheezes, crackles, or rhonchi.  Gastrointestinal: Soft, non tender, and non distended with positive bowel sounds. No rebound or guarding. Musculoskeletal: Nontender with normal range of motion in all extremities. No edema, cyanosis, or erythema of extremities. Neurologic: Normal speech and language. Face is symmetric. Moving all extremities. No  gross focal neurologic deficits are appreciated. Skin: Skin is warm, dry and intact. No rash noted. Psychiatric: Mood and affect are normal. Speech and behavior are normal.  ____________________________________________   LABS (all labs ordered are listed, but only abnormal results are displayed)  Labs Reviewed  COMPREHENSIVE METABOLIC PANEL - Abnormal; Notable for the following:       Result Value   Glucose, Bld 102 (*)    Calcium 8.8 (*)    ALT 11 (*)    All other components within normal limits  CBC - Abnormal; Notable for the following:    RBC 3.68 (*)    HCT 37.9 (*)    MCV 102.8 (*)    MCH 35.6 (*)    RDW 15.0 (*)    All other components within normal limits  ACETAMINOPHEN LEVEL - Abnormal; Notable for the following:    Acetaminophen (Tylenol), Serum <10 (*)    All other components within normal limits  URINALYSIS COMPLETEWITH MICROSCOPIC ([facility] ONLY) - Abnormal; Notable for the following:    Color, Urine YELLOW (*)    APPearance CLEAR (*)    All other components within normal limits  ETHANOL  SALICYLATE LEVEL  TROPONIN I  URINE DRUG SCREEN, QUALITATIVE ([facility] ONLY)   ____________________________________________  EKG  ED ECG REPORT I, Rudene Re, the attending physician, personally viewed and interpreted this ECG.  Normal sinus rhythm, rate of 63, first-degree AV block, RBBB, normal QTC, left axis deviation, T-wave inversions on one in aVL, no ST elevation. Unchanged from prior ____________________________________________  RADIOLOGY  Head CT: No acute finding by CT. Brain atrophy an extensive chronic small vessel ischemic changes. Old cortical infarctions affecting the right temporal and parietal regions. ____________________________________________   PROCEDURES  Procedure(s) performed: None Procedures Critical Care performed:  None ____________________________________________   INITIAL IMPRESSION / ASSESSMENT AND PLAN / ED COURSE  82 y.o.  male history of dementia, CAD, hypertension, hyperlipidemia who presents for evaluation of confusion while driving earlier today. Patient not suppose to be driving and had driver's license revoked in December due to decline cognitive status. Patient is alert and oriented x 3 now, disheveled, no medical complaints. VS WNL. Neuro intact. Will provide medical clearance and attempt to contact family. [facility] consulting APS.  Clinical Course  Comment By Time  Labs, head CT negative. Patient at baseline per patient's great niece who is at the bedside. She will drive him home. Patient will be discharged to the care of his niece. Instructed patient not to drive.  Rudene Re, MD 10/02 [phone]    Pertinent labs & imaging results that were available during my care of the patient were reviewed by me and considered in my medical decision making (see chart for details).    ____________________________________________   FINAL CLINICAL IMPRESSION(S) / ED DIAGNOSES  Final diagnoses:  Dementia without behavioral disturbance, unspecified dementia type  Chronic anemia      NEW MEDICATIONS STARTED DURING THIS VISIT:  New Prescriptions   No medications on file     Note:  This document was prepared using Dragon voice recognition software and may include unintentional dictation errors.    Rudene Re, MD 01/08/16 [phone]

## 2016-01-09 ENCOUNTER — Encounter: Payer: Self-pay | Admitting: Licensed Clinical Social Worker

## 2016-01-09 ENCOUNTER — Encounter: Payer: Self-pay | Admitting: Family Medicine

## 2016-01-09 ENCOUNTER — Ambulatory Visit (INDEPENDENT_AMBULATORY_CARE_PROVIDER_SITE_OTHER): Payer: Medicare Other | Admitting: Family Medicine

## 2016-01-09 VITALS — BP 106/80 | HR 69 | Temp 97.4°F | Wt 202.0 lb

## 2016-01-09 DIAGNOSIS — F0391 Unspecified dementia with behavioral disturbance: Secondary | ICD-10-CM

## 2016-01-09 NOTE — Progress Notes (Signed)
    Subjective:  Michael Morales is a 82 y.o. male who presents to the [facility] today with a chief complaint of dementia. History is provided by the patient and his great-niece.   HPI:  Dementia Patient has a long standing history of dementia. Yesterday, he attempted to drive to his brothers house (his license has been revoked since last year). He ended up getting lost in [place]. A stranger then took him to the police department where he was taken to the ED. Today his niece is requesting assistance with power of attorney and other resources available.  She reports that he has not had running water in his house for 3 years and that he goes to [employer] everyday for food and water. He currently lives along and does not want an aid. He manages his own finances. He does not cook.   ROS: Per HPI  Objective:  Physical Exam: BP 106/80   Pulse 69   Temp 97.4 F (36.3 C) (Oral)   Wt 202 lb (91.6 kg)   SpO2 100%   BMI 25.94 kg/m   Gen: NAD, resting comfortably, unkempt appearing CV: RRR with no murmurs appreciated Pulm: NWOB, CTAB with no crackles, wheezes, or rhonchi MSK: no edema, cyanosis, or clubbing noted Skin: warm, dry Neuro: Oriented to self and date.  Psych: Blunted affect.   Assessment/Plan:  Dementia Patient and niece spoke at length with our social Casimer Lanius today. Please see her attached note. Previously had a MOCa by Dr Lamar Benes of 14/30 earlier this year. Told patient that he should not be driving. Patient still refuses to have aide at home, however niece said that she was currently looking into him staying at his brother house until more permanent housing is found. Recommended that he follow up with the geriatrics clinic, however his niece stated that they needed to get "the legal stuff" sorted out first.   Algis Greenhouse. Jerline Pain, [place] Resident PGY-3 01/09/2016 4:34 PM

## 2016-01-09 NOTE — Patient Instructions (Signed)
Please follow up with our geriatrics clinic with Dr McDiarmid.  If you have any concerns, please feel free to call her or to speak with Neoma Laming.  Take care,  Dr Jerline Pain

## 2016-01-09 NOTE — Assessment & Plan Note (Signed)
Patient and niece spoke at length with our social Casimer Lanius today. Please see her attached note. Previously had a MOCa by Dr Lamar Benes of 14/30 earlier this year. Told patient that he should not be driving. Patient still refuses to have aide at home, however niece said that she was currently looking into him staying at his brother house until more permanent housing is found. Recommended that he follow up with the geriatrics clinic, however his niece stated that they needed to get "the legal stuff" sorted out first.

## 2016-01-10 ENCOUNTER — Telehealth: Payer: Self-pay | Admitting: Licensed Clinical Social Worker

## 2016-01-10 NOTE — Progress Notes (Signed)
Patient ID: Michael Morales, male   DOB: February 13, 1928, 82 y.o.   MRN: [id number]  Social work consult from Dr. Jerline Pain during office visit for social concerns.  Patient has no running water and also wants information on HPOA.  Met with patient and great niece Michael Morales [phone]) who lives local. Patient is alert and oriented X 3. Patient has decide to go live with his brother in [place].  Also wants his niece Michael Morales to be his HPOA. " I need someone to help me with all this stuff, I can't do it by myself".  LCSW answered basic questions about guardianship and HPOA.  Provided niece and patient with booklet for Living Will and HPOA, phone number for legal aid, senior resources and the local Department of Social Services.  Patient and niece appreciative of assistance.  Plan: Niece will assistance patient with moving to [place], contact legal aid for assistance with getting patient's Will, POA and other legal affairs taken care of.  Niece will call LCSW is additional resource are needed.   Casimer Lanius, LCSW Licensed Clinical Social Worker [facility]   [phone] 8:41 AM Late entry 01/10/16
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
## 2016-01-23 ENCOUNTER — Telehealth: Payer: Self-pay | Admitting: Licensed Clinical Social Worker

## 2016-01-23 NOTE — Progress Notes (Signed)
LCSW received phone call from patient's niece Wynona Canes [phone]) , states patient is now living with her brother in [place] (patient's nephew) and is adjusting well.   DMV mailed a form that patient's doctor needs to complete in reference to the possibility of him having limited or restricted driving privileges.  Susanne faxed form to LCSW, LCSW will provide form to patient's PCP to determine if she will complete the form.  Plan: form placed in Dr. Stefano Gaul mail box and in basket message sent on the about request.

## 2016-02-02 ENCOUNTER — Telehealth: Payer: Self-pay | Admitting: Licensed Clinical Social Worker

## 2016-02-02 NOTE — Progress Notes (Signed)
Call to patient's niece Wynona Canes [phone] to inform her previous MD recommended that patient schedule an appointment with Geriatric Clinic at [facility] and that Patient's PCP Dr. Vanetta Shawl has the [facility] form she faxed.    Per Wynona Canes, patient continues to adjust well living with nephew Flavius Witmer [phone] who now has Power of [place].  Possible next step for patient is to try to get him into an assisted living facility.  Return call from [place], states patient has an appointment, [facility] Monday at 8:45 with Dr.McIntyre and would like the DMV form completed.   MD notified.   Casimer Lanius, LCSW Licensed Clinical Social Worker [place]   [phone] 1:53 PM

## 2016-02-05 ENCOUNTER — Encounter: Payer: Self-pay | Admitting: Family Medicine

## 2016-02-05 ENCOUNTER — Ambulatory Visit (INDEPENDENT_AMBULATORY_CARE_PROVIDER_SITE_OTHER): Payer: Medicare Other | Admitting: Family Medicine

## 2016-02-05 VITALS — BP 146/70 | HR 64 | Wt 207.0 lb

## 2016-02-05 DIAGNOSIS — E785 Hyperlipidemia, unspecified: Secondary | ICD-10-CM

## 2016-02-05 DIAGNOSIS — Z9189 Other specified personal risk factors, not elsewhere classified: Secondary | ICD-10-CM

## 2016-02-05 DIAGNOSIS — I5022 Chronic systolic (congestive) heart failure: Secondary | ICD-10-CM

## 2016-02-05 DIAGNOSIS — F0390 Unspecified dementia without behavioral disturbance: Secondary | ICD-10-CM | POA: Diagnosis not present

## 2016-02-05 LAB — LIPID PANEL
CHOL/HDL RATIO: 6.3 ratio — AB (ref <=5.0)
Cholesterol: 209 mg/dL — ABNORMAL HIGH (ref 125–200)
HDL: 33 mg/dL — ABNORMAL LOW (ref >=40)
LDL Cholesterol: 151 mg/dL — ABNORMAL HIGH (ref <130)
Triglycerides: 124 mg/dL (ref <150)
VLDL: 25 mg/dL (ref <30)

## 2016-02-05 LAB — TSH: TSH: 1.73 mIU/L (ref 0.40–4.50)

## 2016-02-05 LAB — VITAMIN B12: Vitamin B-12: 154 pg/mL — ABNORMAL LOW (ref 200–1100)

## 2016-02-05 NOTE — Patient Instructions (Addendum)
Come to geriatrics clinic to see Dr. Wendy Poet on Nov 30.  Getting MRI of brain, and labwork to evaluate for causes of dementia. Checking cholesterol today  Follow up with Dr. Vanetta Shawl in the next month for blood pressure  Be well, Dr. Ardelia Mems

## 2016-02-05 NOTE — Assessment & Plan Note (Signed)
No recent assessment of lipids. Allegedly on simvastatin 80mg  daily, though patient did not bring medications with him today and does not know the names of the medications. Check lipids today, especially in light of recent CT demonstrating prior infarcts. Follow up closely with PCP to manage medical issues.

## 2016-02-05 NOTE — Assessment & Plan Note (Signed)
Symptoms currently suggest NHYA class I (no significant symptoms). May benefit from updated echo, but will defer this to PCP. Needs follow up and to bring all his medications with him to each visit.

## 2016-02-05 NOTE — Assessment & Plan Note (Addendum)
Patient has poor insight into why he should not drive, and that his memory is not adequate for safe driving. My medical opinion is that he should not drive. Forms completed for [facility] stating recommendation he not drive, will scan copy into chart, originals returned to patient/family. Informed patient that I highly doubt he will be issued a drivers license.
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
## 2016-02-05 NOTE — Assessment & Plan Note (Addendum)
[place] 12/30 (with one point added for completing up to 12th grade, and being generous with scoring of clock number placement). Suspect etiology may be vascular dementia given CT head with findings suggesting prior chronic infarcts. Will also work up for secondary/reversible causes: - MRI head - B12, TSH, RPR Appointment scheduled in geriatrics clinic with Dr. McDiarmid for further counseling/assessment Will FYI CSW Casimer Lanius of today's visit as she has been working with family. Patient will benefit from long-term placement in SNF vs ALF.

## 2016-02-06 LAB — RPR

## 2016-02-09 ENCOUNTER — Ambulatory Visit (HOSPITAL_COMMUNITY): Admission: RE | Admit: 2016-02-09 | Payer: Medicare Other | Source: Ambulatory Visit

## 2016-02-13 ENCOUNTER — Telehealth: Payer: Self-pay | Admitting: Licensed Clinical Social Worker

## 2016-02-13 NOTE — Progress Notes (Signed)
Social work consult from Dr. Ardelia Mems to assist patient's family with facility placement. Call to patient's nephew Michael Morales, [place].  States patient is back in [place], his niece is looking after patient and making sure he has food, however they are looking to place him in a facility in [place] ([place]   Per Michael Morales, they are currently waiting to here from the facility.  LCSW explained the placement process and offered assistance. Plan: Michael Morales will call LCSW when he needs the [facility] for patient. Patient has an appointment 03/07/16.  LCSW will follow up at this time if family has not called.  Michael Lanius, LCSW Licensed Clinical Social Worker [facility]   [phone] 12:40 PM

## 2016-02-20 ENCOUNTER — Telehealth: Payer: Self-pay | Admitting: Family Medicine
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
## 2016-02-20 NOTE — Telephone Encounter (Signed)
Will forward to MD to make aware. Haralambos Yeatts,CMA  

## 2016-02-23 ENCOUNTER — Telehealth: Payer: Self-pay | Admitting: Family Medicine

## 2016-02-23 DIAGNOSIS — E538 Deficiency of other specified B group vitamins: Secondary | ICD-10-CM | POA: Insufficient documentation

## 2016-02-23 MED ORDER — CYANOCOBALAMIN 1000 MCG PO TABS: 1000.0000 ug | ORAL_TABLET | Freq: Every day | ORAL | 1 refills | Status: AC

## 2016-02-23 NOTE — Telephone Encounter (Signed)
Called patient's family to discuss b12 level. eft voicemails on nephew's phone and local niece's phone to call us back, before I was able to reach Gwyndolyn Saxon, his nephew that came to his visit with him a few weeks ago.  Explained needs oral repletion of B12. I doubt this is going to have a profound impact on his memory, but it is worth trying. Additionally, cholesterol was up, but this will need an office visit with medication recommend to address.  Gwyndolyn Saxon reports the family is actually planning to pick patient up this weekend and bring him down to [place], and find a place for him to live there. Advised they will need to get him established with a new doctor there, who can monitor b12 levels and medications. Gwyndolyn Saxon appreciative.   He said he did not think patient would come to geriatric clinic on 11/30. Advised we will leave it scheduled for now, in case their plans change, but that he should call back & cancel that appointment if in fact patient will not be attending that visit.  I will send in supplement to his pharmacy. Gwyndolyn Saxon will make sure he gets the medication.  Leeanne Rio, MD

## 2016-03-07 ENCOUNTER — Ambulatory Visit: Payer: Medicare Other

## 2016-03-12 ENCOUNTER — Telehealth: Payer: Self-pay | Admitting: Family Medicine

## 2016-03-12 NOTE — Telephone Encounter (Signed)
Nephew Christinia Gully request FL2 to be mailed to him.  Address [address] [place]  [address].

## 2016-03-12 NOTE — Telephone Encounter (Signed)
Will forward to MD and also clinical Education officer, museum. Verlyn Dannenberg,CMA

## 2016-03-13 MED ORDER — SIMVASTATIN 20 MG PO TABS: 20.0000 mg | ORAL_TABLET | Freq: Every day | ORAL | Status: DC

## 2016-03-13 MED ORDER — SIMVASTATIN 20 MG PO TABS: 20.0000 mg | ORAL_TABLET | Freq: Every day | ORAL | 1 refills | Status: AC

## 2016-03-13 NOTE — Telephone Encounter (Signed)
As I was last provider who saw this patient, I will complete FL2. Called patient's nephew to review his medication list update his ADL dependency on FL2 form. Family is trying to get him into an assisted living facility. Updated medication list in epic. Patient is fairly independent in ADL's.  Notably, he is taking aricept 5mg  daily (which I don't see that we've ever prescribed him). Also, taking simvastatin 80mg  daily, which is too high a dose with his concurrent amlodipine 10mg  daily prescription. Advised nephew this dose is too high and I will be changing the dose to simvastatin 20mg  daily on his FL2 form. Sent new rx in for simvastatin to his new pharmacy in [place].  Also reminded nephew that patient needs to establish with a new physician in [place] to follow up on his medical problems. Nephew understood and is appreciative.  FL2 form completed, will return it to [employer]. FYI to PCP.  Leeanne Rio, MD

## 2016-03-13 NOTE — Progress Notes (Signed)
FL2 completed by Milinda Antis.  Patient will be placed in an Assisted Living facility near his family. LCSW mailed FL2 to patient's HPOA Christinia Gully.  Copy of FL2 placed in box to be scanned into Epic.   Casimer Lanius, LCSW Licensed Clinical Social Worker [place]   [phone] 2:00 PM

## 2016-04-02 ENCOUNTER — Telehealth: Payer: Self-pay | Admitting: Internal Medicine

## 2016-04-02 NOTE — Telephone Encounter (Signed)
**  After Hours/ Emergency Line Call*  Received a call to report that Michael Morales. Call from Ms. Jerian Purdue his wife. She asked if we could change diet order on FL2 form to heart healthy diet to regular or no salt.  Additionally, asked if we could change the main diagnosis from Dementia to something else as this is not his main issue as the nursing home is concerned that he might "walk out of here and not know where he is going". Ms. Ajee Cumpston says that he has not had issues with this in the past.   Will forward to Dr. Ardelia Mems who completed this form.   Smiley Houseman, MD PGY-2, [facility]

## 2016-04-05 ENCOUNTER — Encounter: Payer: Self-pay | Admitting: Licensed Clinical Social Worker

## 2016-04-05 NOTE — Progress Notes (Signed)
LCSW received revised FL2 from Dr. Ardelia Mems.   Patient will be placed in an Assisted Living facility near his family. FL2 mailed to patient's HPOA Christinia Gully and a copy placed in box to be scanned into Epic.  Casimer Lanius, LCSW Licensed Clinical Social Worker [place]   [phone] 1:43 PM

## 2016-04-05 NOTE — Telephone Encounter (Signed)
Called Arbie Cookey (niece) to discuss. They toured a facility and are needing the FL2 to be updated before the facility will accept him. They only offer 3 diet options: regular, no salt, and no commercial sweets. Need diet to say "no salt".  She also said the facility intake person had some minor concerns about dementia being listed as the primary diagnosis, as the social services dept could audit them and bring up concerns about him needing a higher level of care. They were not asking for the diagnosis to be changed.  Niece reiterated to me that patient does NOT wander. He stays home and does well. He is appropriate for assisted living level of care. Dementia is the main reason he needs this level of care. Spoke with Casimer Lanius CSW who recommended that dementia is best listed as the primary dx. Will leave as primary dx.   New FL2 with updated diet recommendations completed and given to Casimer Lanius. Again reiterated to Arbie Cookey that patient needs to establish with a new doctor in [place] to follow up on his medical issues and do paperwork long term.  Leeanne Rio, MD

## 2016-04-12 ENCOUNTER — Telehealth: Payer: Self-pay | Admitting: Family Medicine

## 2016-04-12 NOTE — Progress Notes (Signed)
LCSW received message that patient's POA Christinia Gully has not received the revised FL2 that was mailed.   FL2 placed in [facility] mail on 04/05/16, however was delayed leaving due to the [place].   LCSW called Christinia Gully, the facility [facility] is waiting for the FL2.  William asked LCSW to fax FL2 directly to the facility. FL2 faxed to number provided by Gwyndolyn Saxon.  Casimer Lanius, LCSW Licensed Clinical Social Worker [place]   [phone] 11:54 AM

## 2016-04-12 NOTE — Telephone Encounter (Signed)
Michael Morales called to check the status of the [facility] forms for [facility]. Did we mail them? The facility has not received them yet. If we made a copy can we fax this to [phone]. If we did all these forms and do not have any copies can we write a letter stating what his diet is to the facility? If you have any questions please call. jw

## 2016-04-12 NOTE — Telephone Encounter (Signed)
I completed this last week and gave FL2 to Casimer Lanius, who was going to mail it to the family.  Leeanne Rio, MD

## 2016-04-12 NOTE — Telephone Encounter (Signed)
Will forward to MD and also LCSW. Jazmin Hartsell,CMA

## 2016-04-16 ENCOUNTER — Telehealth: Payer: Self-pay | Admitting: Family Medicine

## 2016-04-16 NOTE — Telephone Encounter (Signed)
[facility] needs clarification on an order for oxygen. Order was signed by Dr. Ardelia Mems. ep

## 2016-04-16 NOTE — Telephone Encounter (Signed)
Will forward to MD to advise. Abdulloh Ullom,CMA  

## 2016-04-18 NOTE — Telephone Encounter (Signed)
Called [facility] back Apparently they were under the impression that it was designated on the [facility] that patient needs oxygen To my knowledge he is not on O2 - has never been on it when I see him and I see no documentation of prior oxygen therapy in his chart.  I reviewed our copy of the FL2 that was sent to them, and I did NOT mark on there that patient requires oxygen therapy. Unsure where this notion came from. I advised [facility] that this was not ordered. They agreed to cancel the order.  I did attempt to contact his nephew and niece to confirm with them that he does not use oxygen, but they did not answer. Will attempt again later.  Leeanne Rio, MD

## 2016-04-18 NOTE — Telephone Encounter (Signed)
Attempted again to contact niece/nephew - no answer on either line Leeanne Rio, MD

## 2016-04-18 NOTE — Telephone Encounter (Signed)
[facility] left voice message of nurse line needing clarification on oxygen order. Please give them a call at [phone].  Derl Barrow, RN

## 2016-04-27 DIAGNOSIS — R51 Headache: Secondary | ICD-10-CM | POA: Diagnosis not present

## 2016-04-27 DIAGNOSIS — Z7902 Long term (current) use of antithrombotics/antiplatelets: Secondary | ICD-10-CM | POA: Diagnosis not present

## 2016-04-27 DIAGNOSIS — I10 Essential (primary) hypertension: Secondary | ICD-10-CM | POA: Diagnosis not present

## 2016-04-27 DIAGNOSIS — Z79899 Other long term (current) drug therapy: Secondary | ICD-10-CM | POA: Diagnosis not present

## 2016-04-27 DIAGNOSIS — F0390 Unspecified dementia without behavioral disturbance: Secondary | ICD-10-CM | POA: Diagnosis not present

## 2016-04-27 DIAGNOSIS — E785 Hyperlipidemia, unspecified: Secondary | ICD-10-CM | POA: Diagnosis not present

## 2016-04-27 DIAGNOSIS — Z7982 Long term (current) use of aspirin: Secondary | ICD-10-CM | POA: Diagnosis not present

## 2016-05-15 ENCOUNTER — Encounter: Payer: Self-pay | Admitting: Family Medicine

## 2016-05-15 NOTE — Progress Notes (Signed)
Received faxed order form to authorize refills for this patient's medications (all are as listed in Epic) from [facility] in [place], [place] ([place] [id number], f [id number]).  Order form lists that the default is that refills are authorized for 12 months. I called this pharmacy to inquire, as I have previously instructed family to get patient a new doctor in his new city since he has moved.  Was advised just to write on the form to authorize less refills. Authorized 3 months of refills. Will fax back.  Leeanne Rio, MD

## 2016-07-31 DIAGNOSIS — G301 Alzheimer's disease with late onset: Secondary | ICD-10-CM | POA: Diagnosis not present

## 2016-10-12 DIAGNOSIS — G301 Alzheimer's disease with late onset: Secondary | ICD-10-CM | POA: Diagnosis not present

## 2017-01-02 DIAGNOSIS — G301 Alzheimer's disease with late onset: Secondary | ICD-10-CM | POA: Diagnosis not present

## 2017-03-01 IMAGING — CT CT HEAD W/O CM
4 series · 17 of 47 positions shown, 19 images · non-contrast
Comparison: 02/27/2011.  MRI 03/07/2011.

CLINICAL DATA: Acute confusion.

EXAM:
CT HEAD WITHOUT CONTRAST
TECHNIQUE: Contiguous axial images were obtained from the base of the skull
through the vertex without intravenous contrast.

[Series 2: head wo · axial · 0.44mm/px · z∈[+1134,+1254]mm · 7 of 32 slices shown, 9 images]
[im 4/32  brain]
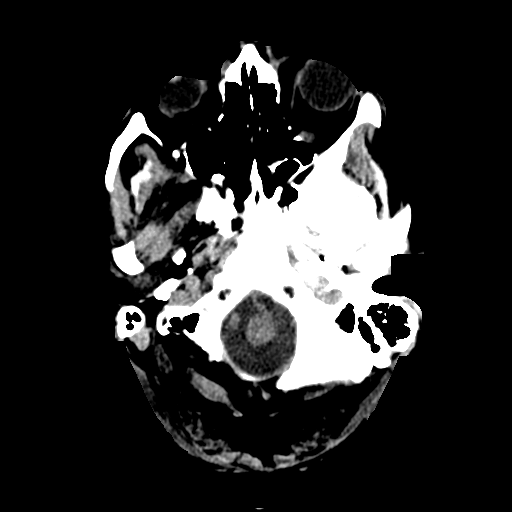
[im 4/32  bone]
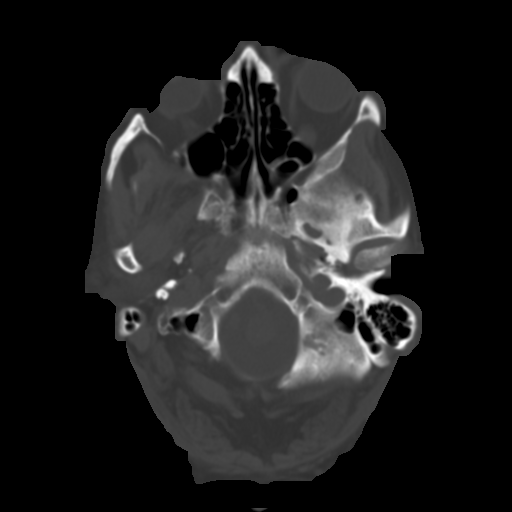
[im 8/32  brain]
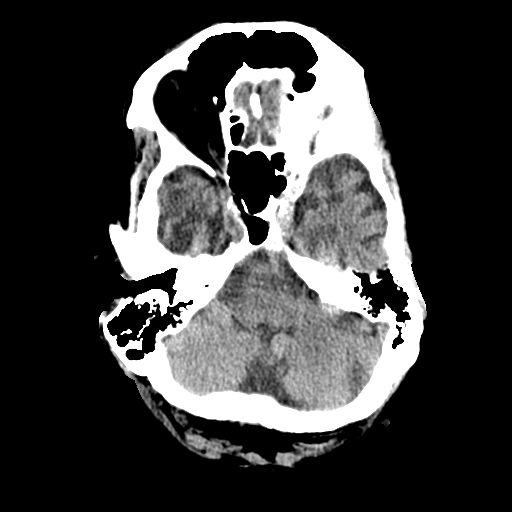
[im 12/32  brain]
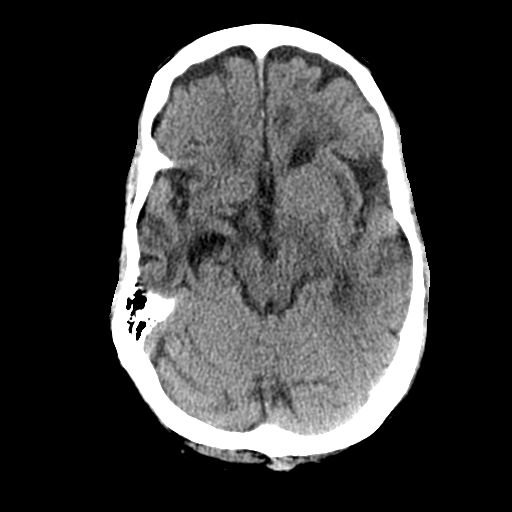
[im 16/32  brain]
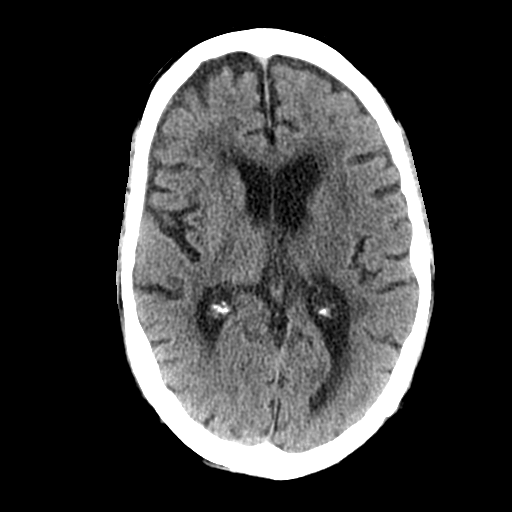
[im 20/32  brain]
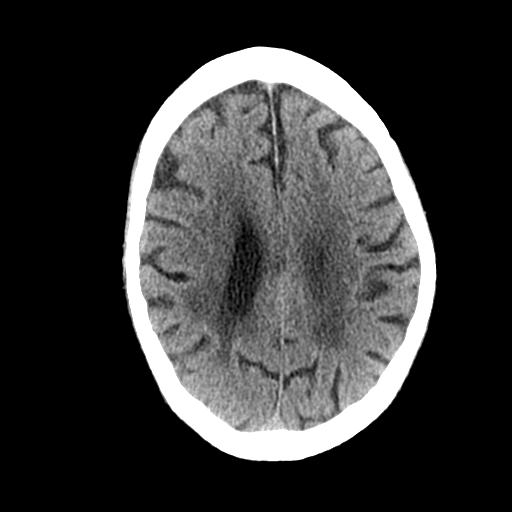
[im 20/32  bone]
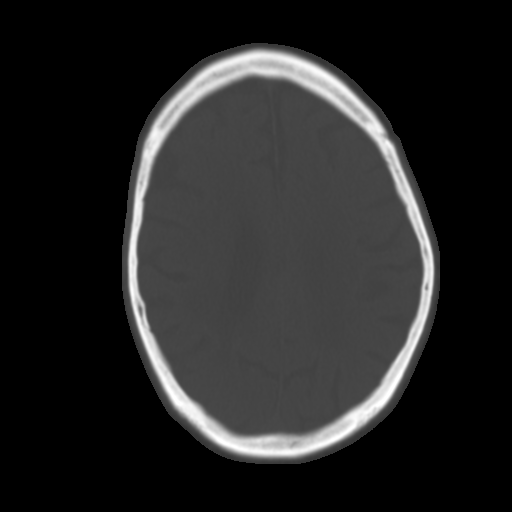
[im 24/32  brain]
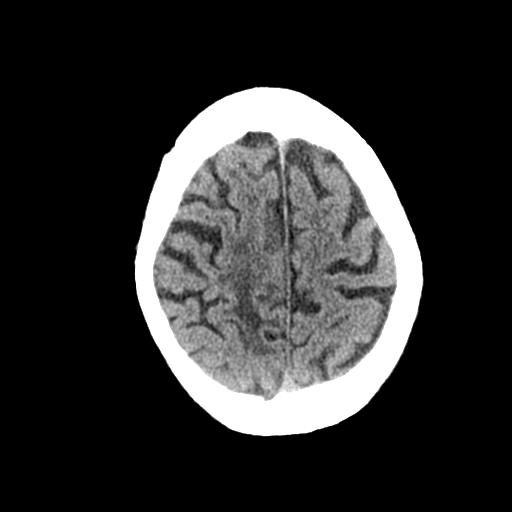
[im 28/32  brain]
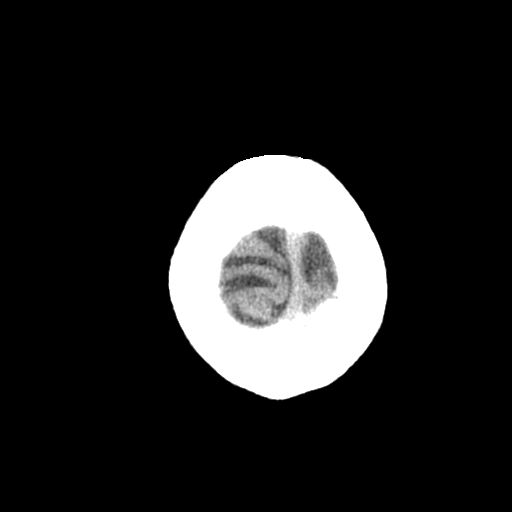

[Series 3: head bone · axial · 0.44mm/px · z∈[+1133,+1189]mm · 4 of 80 slices shown]
[im 8/80  bone]
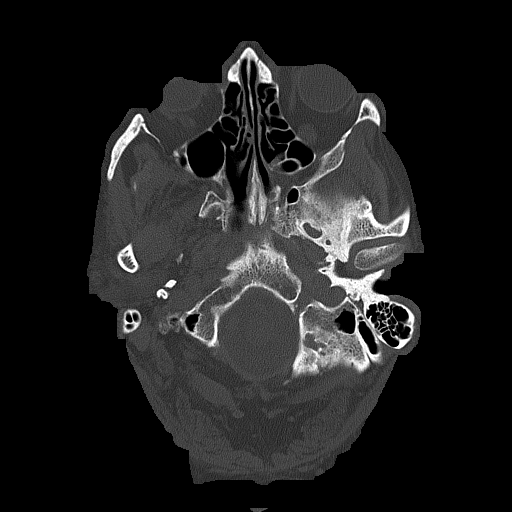
[im 16/80  bone]
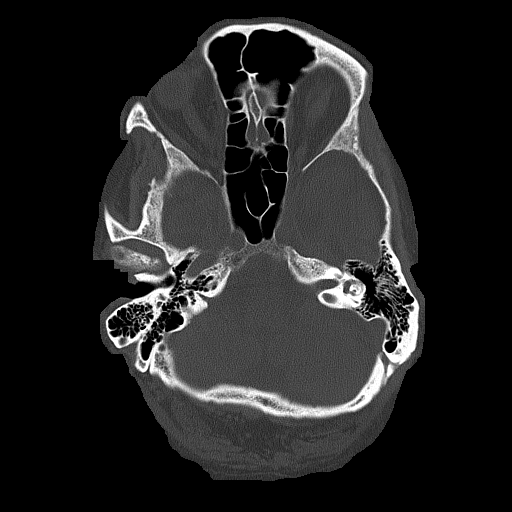
[im 24/80  bone]
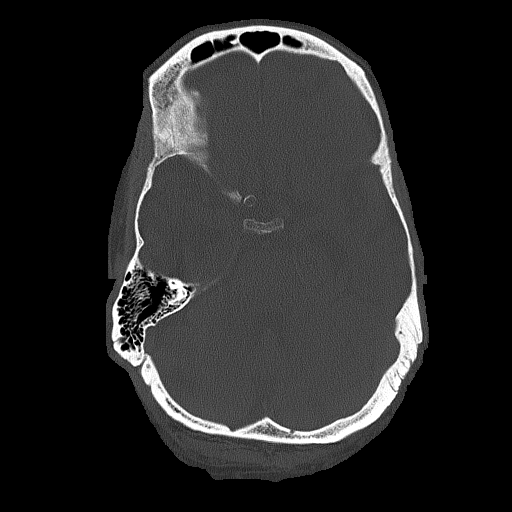
[im 36/80  bone]
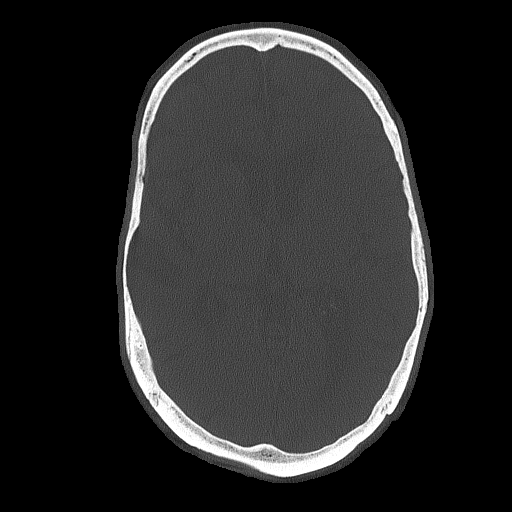

[Series 4: coronal soft tissue · coronal · 0.32mm/px · 3 of 76 slices shown]
[im 26/76  brain]
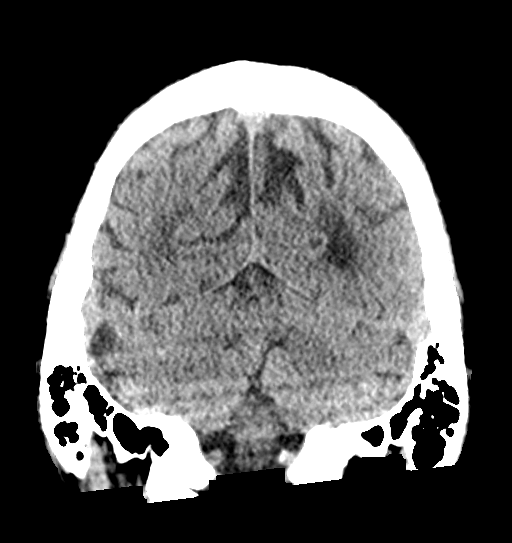
[im 34/76  brain]
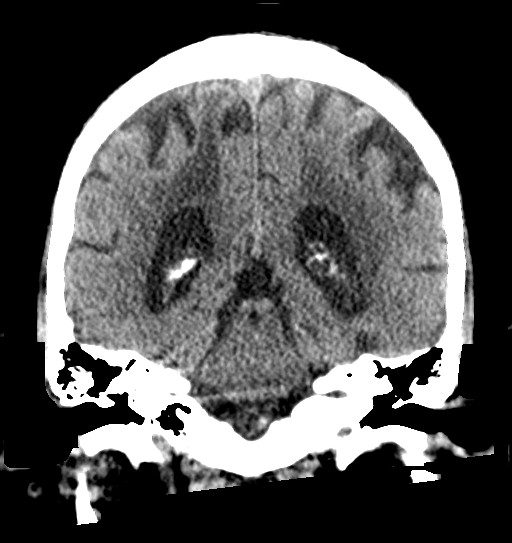
[im 42/76  brain]
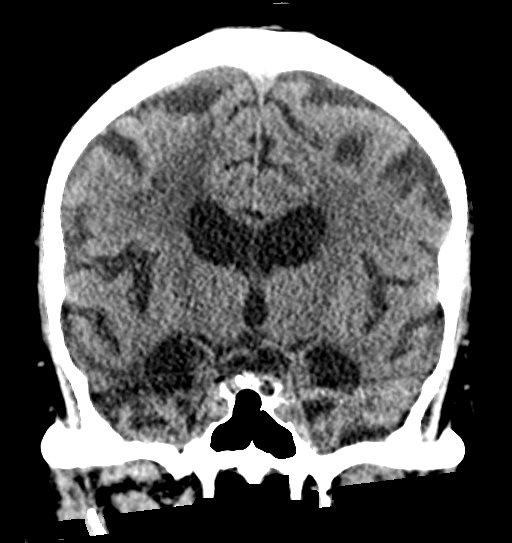

[Series 5: sagittal soft tissue · sagittal · 0.31mm/px · 3 of 56 slices shown]
[im 22/56  brain]
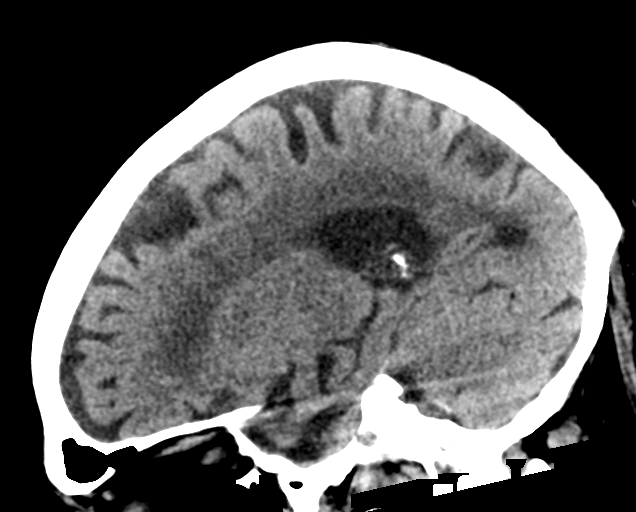
[im 28/56  brain]
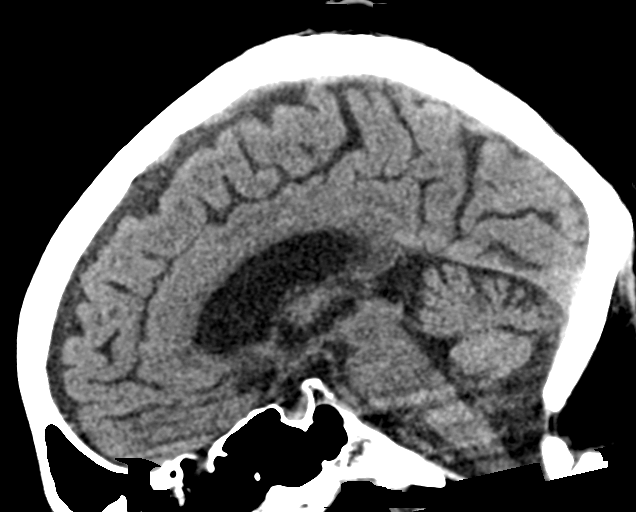
[im 34/56  brain]
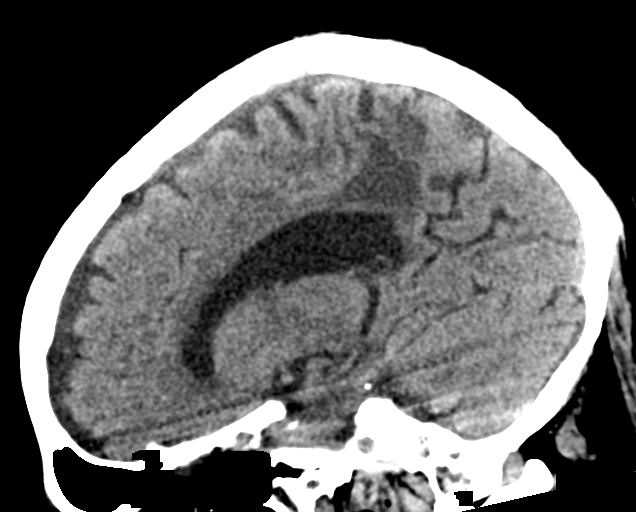

[17 of 47 positions shown; findings below may reference images not displayed]

FINDINGS: Brain: The brain shows generalized atrophy. There are extensive
chronic small vessel ischemic changes throughout the cerebral
hemispheric white matter. There are old cortical and subcortical
infarctions affecting the right parietal and right temporal lobes.
No sign of acute infarction, mass lesion, hemorrhage, hydrocephalus
or extra-axial collection.

Vascular: There is atherosclerotic calcification of the major
vessels at the base of the brain.

Skull: Normal

Sinuses/Orbits: Clear/normal

Other: None
IMPRESSION: No acute finding by CT. Brain atrophy an extensive chronic small
vessel ischemic changes. Old cortical infarctions affecting the
right temporal and parietal regions.

## 2017-07-17 DIAGNOSIS — I2510 Atherosclerotic heart disease of native coronary artery without angina pectoris: Secondary | ICD-10-CM | POA: Diagnosis not present

## 2017-07-17 DIAGNOSIS — G301 Alzheimer's disease with late onset: Secondary | ICD-10-CM | POA: Diagnosis not present

## 2017-07-17 DIAGNOSIS — I5032 Chronic diastolic (congestive) heart failure: Secondary | ICD-10-CM | POA: Diagnosis not present

## 2017-07-17 DIAGNOSIS — I10 Essential (primary) hypertension: Secondary | ICD-10-CM | POA: Diagnosis not present

## 2017-09-30 DIAGNOSIS — D239 Other benign neoplasm of skin, unspecified: Secondary | ICD-10-CM | POA: Diagnosis not present

## 2017-09-30 DIAGNOSIS — Z7902 Long term (current) use of antithrombotics/antiplatelets: Secondary | ICD-10-CM | POA: Diagnosis not present

## 2017-09-30 DIAGNOSIS — D2230 Melanocytic nevi of unspecified part of face: Secondary | ICD-10-CM | POA: Diagnosis not present

## 2017-09-30 DIAGNOSIS — Z79899 Other long term (current) drug therapy: Secondary | ICD-10-CM | POA: Diagnosis not present

## 2017-09-30 DIAGNOSIS — Z7982 Long term (current) use of aspirin: Secondary | ICD-10-CM | POA: Diagnosis not present

## 2017-09-30 DIAGNOSIS — D229 Melanocytic nevi, unspecified: Secondary | ICD-10-CM | POA: Diagnosis not present

## 2019-08-07 DEATH — deceased
